# Patient Record
Sex: Male | Born: 1980 | Race: White | Hispanic: No | Smoking: Never smoker
Health system: Southern US, Community
[De-identification: ages and names within clinical notes are randomized; demographics above are authoritative.]

## PROBLEM LIST (undated history)

## (undated) DIAGNOSIS — M549 Dorsalgia, unspecified: Secondary | ICD-10-CM

## (undated) DIAGNOSIS — B009 Herpesviral infection, unspecified: Secondary | ICD-10-CM

## (undated) DIAGNOSIS — T7840XA Allergy, unspecified, initial encounter: Secondary | ICD-10-CM

## (undated) DIAGNOSIS — L409 Psoriasis, unspecified: Secondary | ICD-10-CM

## (undated) DIAGNOSIS — F32A Depression, unspecified: Secondary | ICD-10-CM

## (undated) DIAGNOSIS — A4902 Methicillin resistant Staphylococcus aureus infection, unspecified site: Secondary | ICD-10-CM

## (undated) DIAGNOSIS — M1990 Unspecified osteoarthritis, unspecified site: Secondary | ICD-10-CM

## (undated) DIAGNOSIS — G709 Myoneural disorder, unspecified: Secondary | ICD-10-CM

## (undated) HISTORY — DX: Psoriasis, unspecified: L40.9

## (undated) HISTORY — DX: Unspecified osteoarthritis, unspecified site: M19.90

## (undated) HISTORY — DX: Dorsalgia, unspecified: M54.9

## (undated) HISTORY — DX: Depression, unspecified: F32.A

## (undated) HISTORY — DX: Methicillin resistant Staphylococcus aureus infection, unspecified site: A49.02

## (undated) HISTORY — DX: Myoneural disorder, unspecified: G70.9

## (undated) HISTORY — DX: Allergy, unspecified, initial encounter: T78.40XA

## (undated) HISTORY — PX: KNEE ARTHROTOMY: SUR107

## (undated) HISTORY — DX: Herpesviral infection, unspecified: B00.9

---
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
## 2002-08-12 ENCOUNTER — Ambulatory Visit (HOSPITAL_COMMUNITY): Admission: RE | Admit: 2002-08-12 | Discharge: 2002-08-12 | Payer: Self-pay

## 2005-07-11 HISTORY — PX: TONSILLECTOMY: SUR1361

## 2005-08-11 ENCOUNTER — Ambulatory Visit (HOSPITAL_COMMUNITY): Admission: RE | Admit: 2005-08-11 | Discharge: 2005-08-11 | Payer: Self-pay | Admitting: Otolaryngology

## 2011-06-06 ENCOUNTER — Ambulatory Visit: Payer: Self-pay | Admitting: Sports Medicine

## 2011-11-09 DIAGNOSIS — A6000 Herpesviral infection of urogenital system, unspecified: Secondary | ICD-10-CM | POA: Insufficient documentation

## 2011-12-29 ENCOUNTER — Ambulatory Visit: Payer: Self-pay | Admitting: Physician Assistant

## 2012-01-03 ENCOUNTER — Ambulatory Visit: Payer: Self-pay | Admitting: Family Medicine

## 2012-01-03 VITALS — BP 128/73 | HR 78 | Temp 98.3°F | Resp 16 | Ht 71.5 in | Wt 207.0 lb

## 2012-01-03 DIAGNOSIS — M25569 Pain in unspecified knee: Secondary | ICD-10-CM | POA: Insufficient documentation

## 2012-01-03 DIAGNOSIS — J019 Acute sinusitis, unspecified: Secondary | ICD-10-CM

## 2012-01-03 DIAGNOSIS — J329 Chronic sinusitis, unspecified: Secondary | ICD-10-CM | POA: Insufficient documentation

## 2012-01-03 DIAGNOSIS — G8929 Other chronic pain: Secondary | ICD-10-CM | POA: Insufficient documentation

## 2012-01-03 MED ORDER — CEFUROXIME AXETIL 250 MG PO TABS: 500.0000 mg | ORAL_TABLET | Freq: Two times a day (BID) | ORAL | Status: AC

## 2012-01-03 NOTE — Patient Instructions (Addendum)

## 2012-01-03 NOTE — Progress Notes (Signed)
  Subjective:    Patient ID: Walter Evans, male    DOB: [birth year], 31 y.o.   MRN: [id number]  HPI Thios 31 y.o. Cauc male has chronic recurrent sinus infections occurring once a year. 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 He uses a NETI pot and OTC decongestant with some relief. Complains  of sinus pressure with post nasal drainage and sore throat. Not febrile today. Now has a productive cough.  Pt has been evaluated by ENT with CT scan in past; advised to consider Septoplasty.    Review of Systems  Constitutional: Negative for chills and appetite change.  HENT: Positive for congestion, sore throat and sinus pressure. Negative for rhinorrhea.   Respiratory: Positive for cough. Negative for chest tightness and shortness of breath.   Neurological: Negative.        Objective:   Physical Exam  Nursing note and vitals reviewed. Constitutional: He is oriented to person, place, and time. He appears well-developed and well-nourished. No distress.  HENT:  Head: Normocephalic and atraumatic.  Right Ear: External ear normal.  Left Ear: External ear normal.       Posterior pharynx erythematous with cobblestoning; Sinuses tender with percussion  Eyes: Conjunctivae and EOM are normal. No scleral icterus.  Neck: Normal range of motion. Neck supple. No thyromegaly present.  Cardiovascular: Normal rate.   Pulmonary/Chest: Breath sounds normal. He is in respiratory distress.  Lymphadenopathy:    He has no cervical adenopathy.  Neurological: He is alert and oriented to person, place, and time. No cranial nerve deficit.  Skin: Skin is warm and dry.          Assessment & Plan:   1. Sinusitis, chronic   Continue NETI pot and other symptomatic measures                                   RX: Cefuroxime 500 mg    # 20  1 tablet bid      Pt will consider further evaluation by ENT but may  need CT of sinuses for updating status

## 2012-02-16 ENCOUNTER — Ambulatory Visit (INDEPENDENT_AMBULATORY_CARE_PROVIDER_SITE_OTHER): Payer: No Typology Code available for payment source | Admitting: Internal Medicine

## 2012-02-16 VITALS — BP 126/77 | HR 87 | Temp 99.0°F | Resp 16 | Ht 72.0 in | Wt 203.0 lb

## 2012-02-16 DIAGNOSIS — L03119 Cellulitis of unspecified part of limb: Secondary | ICD-10-CM

## 2012-02-16 DIAGNOSIS — L02419 Cutaneous abscess of limb, unspecified: Secondary | ICD-10-CM

## 2012-02-16 DIAGNOSIS — S81809A Unspecified open wound, unspecified lower leg, initial encounter: Secondary | ICD-10-CM

## 2012-02-16 DIAGNOSIS — L738 Other specified follicular disorders: Secondary | ICD-10-CM

## 2012-02-16 DIAGNOSIS — IMO0002 Reserved for concepts with insufficient information to code with codable children: Secondary | ICD-10-CM

## 2012-02-16 DIAGNOSIS — L739 Follicular disorder, unspecified: Secondary | ICD-10-CM

## 2012-02-16 DIAGNOSIS — B958 Unspecified staphylococcus as the cause of diseases classified elsewhere: Secondary | ICD-10-CM

## 2012-02-16 MED ORDER — DOXYCYCLINE HYCLATE 100 MG PO TABS: 100.0000 mg | ORAL_TABLET | Freq: Two times a day (BID) | ORAL | Status: DC

## 2012-02-16 MED ORDER — CEFTRIAXONE SODIUM 1 G IJ SOLR
1.0000 g | Freq: Once | INTRAMUSCULAR | Status: AC
  Administered 2012-02-16: 1 g via INTRAMUSCULAR

## 2012-02-16 MED ORDER — MUPIROCIN 2 % EX OINT: TOPICAL_OINTMENT | Freq: Two times a day (BID) | CUTANEOUS | Status: AC

## 2012-02-16 NOTE — Progress Notes (Signed)
  Subjective:    Patient ID: Walter Evans, male    DOB: 1980-11-16, 31 y.o.   MRN: [id number]  HPI Has many infected bug bites, some are now moderate abscesses, onlegs and arms mostly. He has used home tools to try and open and drain not with much success  Review of Systems     Objective:   Physical Exam  Nursing note and vitals reviewed. Constitutional: He is oriented to person, place, and time. He appears well-developed and well-nourished.  HENT:  Nose: Nose normal.  Eyes: EOM are normal.  Pulmonary/Chest: Effort normal.  Musculoskeletal: Normal range of motion. He exhibits no edema and no tenderness.  Neurological: He is alert and oriented to person, place, and time.  Skin: Skin is warm. Rash noted. There is erythema.  Psychiatric: He has a normal mood and affect.    Red indurated tender and warm areas arms and legs R thigh and calf the worst. General health is good and no fever       Assessment & Plan:  Rocephin 1g I and D larger wound leg Wound care

## 2012-02-16 NOTE — Patient Instructions (Addendum)
MRSA Overview MRSA stands for methicillin-resistant Staphylococcus aureus. It is a type of bacteria that is resistant to some common antibiotics. It can cause infections in the skin and many other places in the body. Staphylococcus aureus, often called "staph," is a bacteria that normally lives on the skin or in the nose. Staph on the surface of the skin or in the nose does not cause problems. However, if the staph enters the body through a cut, wound, or break in the skin, an infection can happen. Up until recently, infections with the MRSA type of staph mainly occurred in hospitals and other healthcare settings. There are now increasing problems with MRSA infections in the community as well. Infections with MRSA may be very serious or even life-threatening. Most MRSA infections are acquired in one of two ways:  Healthcare-associated MRSA (HA-MRSA)   This can be acquired by people in any healthcare setting. MRSA can be a big problem for hospitalized people, people in nursing homes, people in rehabilitation facilities, people with weakened immune systems, dialysis patients, and those who have had surgery.   Community-associated MRSA (CA-MRSA)   Community spread of MRSA is becoming more common. It is known to spread in crowded settings, in jails and prisons, and in situations where there is close skin-to-skin contact, such as during sporting events or in locker rooms. MRSA can be spread through shared items, such as children's toys, razors, towels, or sports equipment.  CAUSES  All staph, including MRSA, are normally harmless unless they enter the body through a scratch, cut, or wound, such as with surgery. All staph, including MRSA, can be spread from person-to-person by touching contaminated objects or through direct contact. SPECIAL GROUPS MRSA can present problems for special groups of people. Some of these groups include:  Breastfeeding women.   The most common problem is MRSA infection of the  breast (mastitis). There is evidence that MRSA can be passed to an infant from infected breast milk. Your caregiver may recommend that you stop breastfeeding until the mastitis is under control.   If you are breastfeeding and have a MRSA infection in a place other than the breast, you may usually continue breastfeeding while under treatment. If taking antibiotics, ask your caregiver if it is safe to continue breastfeeding while taking your prescribed medicines.   Neonates (babies from birth to 1 month old) and infants (babies from 1 month to 1 year old).   There is evidence that MRSA can be passed to a newborn at birth if the mother has MRSA on the skin, in or around the birth canal, or an infection in the uterus, cervix, or vagina. MRSA infection can have the same appearance as a normal newborn or infant rash or several other skin infections. This can make it hard to diagnose MRSA.   Immune compromised people.   If you have an immune system problem, you may have a higher chance of developing a MRSA infection.   People after any type of surgery.   Staph in general, including MRSA, is the most common cause of infections occurring at the site of recent surgery.   People on long-term steroid medicines.   These kinds of medicines can lower your resistance to infection. This can increase your chance of getting MRSA.   People who have had frequent hospitalizations, live in nursing homes or other residential care facilities, have venous or urinary catheters, or have taken multiple courses of antibiotic therapy for any reason.  DIAGNOSIS  Diagnosis of MRSA is   done by cultures of fluid samples that may come from:  Swabs taken from cuts or wounds in infected areas.   Nasal swabs.   Saliva or deep cough specimens from the lungs (sputum).   Urine.   Blood.  Many people are "colonized" with MRSA but have no signs of infection. This means that people carry the MRSA germ on their skin or in their  nose and may never develop MRSA infection.  TREATMENT  Treatment varies and is based on how serious, how deep, or how extensive the infection is. For example:  Some skin infections, such as a small boil or abscess, may be treated by draining yellowish-white fluid (pus) from the site of the infection.   Deeper or more widespread soft tissue infections are usually treated with surgery to drain pus and with antibiotic medicine given by vein or by mouth. This may be recommended even if you are pregnant.   Serious infections may require a hospital stay.  If antibiotics are given, they may be needed for several weeks. PREVENTION  Because many people are colonized with staph, including MRSA, preventing the spread of the bacteria from person-to-person is most important. The best way to prevent the spread of bacteria and other germs is through proper hand washing or by using alcohol-based hand disinfectants. The following are other ways to help prevent MRSA infection within the hospital and community settings.   Healthcare settings:   Strict hand washing or hand disinfection procedures need to be followed before and after touching every patient.   Patients infected with MRSA are placed in isolation to prevent the spread of the bacteria.   Healthcare workers need to wear disposable gowns and gloves when touching or caring for patients infected with MRSA. Visitors may also be asked to wear a gown and gloves.   Hospital surfaces need to be disinfected frequently.   Community settings:   Wash your hands frequently with soap and water for at least 15 seconds. Otherwise, use alcohol-based hand disinfectants when soap and water is not available.   Make sure people who live with you wash their hands often, too.   Do not share personal items. For example, avoid sharing razors and other personal hygiene items, towels, clothing, and athletic equipment.   Wash and dry your clothes and bedding at the  warmest temperatures recommended on the labels.   Keep wounds covered. Pus from infected sores may contain MRSA and other bacteria. Keep cuts and abrasions clean and covered with germ-free (sterile), dry bandages until they are healed.   If you have a wound that appears infected, ask your caregiver if a culture for MRSA and other bacteria should be done.   If you are breastfeeding, talk to your caregiver about MRSA. You may be asked to temporarily stop breastfeeding.  HOME CARE INSTRUCTIONS   Take your antibiotics as directed. Finish them even if you start to feel better.   Avoid close contact with those around you as much as possible. Do not use towels, razors, toothbrushes, bedding, or other items that will be used by others.   To fight the infection, follow your caregiver's instructions for wound care. Wash your hands before and after changing your bandages.   If you have an intravascular device, such as a catheter, make sure you know how to care for it.   Be sure to tell any healthcare providers that you have MRSA so they are aware of your infection.  SEEK IMMEDIATE MEDICAL CARE IF:     The infection appears to be getting worse. Signs include:   Increased warmth, redness, or tenderness around the wound site.   A red line that extends from the infection site.   A dark color in the area around the infection.   Wound drainage that is tan, yellow, or green.   A bad smell coming from the wound.   You feel sick to your stomach (nauseous) and throw up (vomit) or cannot keep medicine down.   You have a fever.   Your baby is older than 3 months with a rectal temperature of 102 F (38.9 C) or higher.   Your baby is 3 months old or younger with a rectal temperature of 100.4 F (38 C) or higher.   You have difficulty breathing.  MAKE SURE YOU:   Understand these instructions.   Will watch your condition.   Will get help right away if you are not doing well or get worse.    Document Released: 06/27/2005 Document Revised: 06/16/2011 Document Reviewed: 09/29/2010 [employer] Patient Information 2012 [employer]. 

## 2012-02-16 NOTE — Progress Notes (Signed)
Patient ID: GUNTHER ZAWADZKI MRN: [id number], DOB: 1981-03-27, 31 y.o. Date of Encounter: 02/16/2012, 1:55 PM    PROCEDURE NOTE: Verbal consent obtained. Betadine prep per usual protocol. Local anesthesia obtained with 2% lidocaine plain.  1 cm incision made with 11 blade along lesion.  Culture taken. Minimal purulence expressed. Lesion explored revealing no loculations. Packed with 1/4 packing plain. Dressed. Wound care instructions including precautions with patient. Patient tolerated the procedure well. Recheck in 48 hours.      Grier Mitts, PA-C 02/16/2012 1:55 PM

## 2012-02-18 ENCOUNTER — Ambulatory Visit (INDEPENDENT_AMBULATORY_CARE_PROVIDER_SITE_OTHER): Payer: No Typology Code available for payment source | Admitting: Physician Assistant

## 2012-02-18 VITALS — BP 125/75 | HR 81 | Temp 98.0°F | Resp 16 | Ht 72.0 in | Wt 205.0 lb

## 2012-02-18 DIAGNOSIS — L02419 Cutaneous abscess of limb, unspecified: Secondary | ICD-10-CM

## 2012-02-18 DIAGNOSIS — L03119 Cellulitis of unspecified part of limb: Secondary | ICD-10-CM

## 2012-02-18 NOTE — Progress Notes (Signed)
Patient ID: Walter Evans MRN: [id number], DOB: 04/10/1981 31 y.o. Date of Encounter: 02/18/2012, 1:12 PM  Chief Complaint: Wound care   See previous note  HPI: 31 y.o. y/o male presents for wound care s/p I&D on 02/16/12. Doing well No issues or complaints Afebrile/ no chills No nausea or vomiting Tolerating doxycycline. Pain stable. Daily dressing change Previous note reviewed  No past medical history on file.   Home Meds: Prior to Admission medications   Medication Sig Start Date End Date Taking? Authorizing Provider  doxycycline (VIBRA-TABS) 100 MG tablet Take 1 tablet (100 mg total) by mouth 2 (two) times daily. 02/16/12 02/26/12 Yes Jonita Albee, MD  mupirocin ointment (BACTROBAN) 2 % Apply topically 2 (two) times daily. 02/16/12 02/23/12 Yes Jonita Albee, MD    Allergies:  Allergies  Allergen Reactions  . Shellfish Allergy     unknown    ROS: Constitutional: Afebrile, no chills Cardiovascular: negative for chest pain or palpitations Dermatological: Positive for wound. Negative for erythema, pain, or warmth. Positive for purulent drainage.  GI: No nausea or vomiting   EXAM: Physical Exam: Blood pressure 125/75, pulse 81, temperature 98 F (36.7 C), temperature source Oral, resp. rate 16, height 6' (1.829 m), weight 205 lb (92.987 kg)., Body mass index is 27.80 kg/(m^2). General: Well developed, well nourished, in no acute distress. Nontoxic appearing. Head: Normocephalic, atraumatic, sclera non-icteric.  Neck: Supple. Lungs: Breathing is unlabored. Heart: Normal rate. Skin:  Warm and moist. Dressing and packing in place. No induration, erythema, or tenderness to palpation. Neuro: Alert and oriented X 3. Moves all extremities spontaneously. Normal gait.  Psych:  Responds to questions appropriately with a normal affect.       PROCEDURE: Dressing and packing removed. No purulence expressed. There was yellow purulence on the bandage. Wound bed healthy. Eschar  debrided Irrigated with 2% plain lidocaine 5 cc. Repacked with 1/4 plain packing. Dressing applied  LAB: Culture:   A/P: 31 y.o. y/o male with leg cellulitis/abscess as above s/p I&D on Wound care per above Continue doxycycline. Pain well controlled Daily dressing changes Recheck 48 hours  Signed, Rhoderick Moody, PA-C 02/18/2012 1:12 PM

## 2012-02-19 LAB — WOUND CULTURE
Gram Stain: NONE SEEN
Gram Stain: NONE SEEN
Gram Stain: NONE SEEN

## 2012-02-20 ENCOUNTER — Ambulatory Visit (INDEPENDENT_AMBULATORY_CARE_PROVIDER_SITE_OTHER): Payer: No Typology Code available for payment source | Admitting: Physician Assistant

## 2012-02-20 VITALS — BP 119/70 | HR 70 | Temp 98.9°F | Resp 18

## 2012-02-20 DIAGNOSIS — L02419 Cutaneous abscess of limb, unspecified: Secondary | ICD-10-CM

## 2012-02-20 DIAGNOSIS — L03119 Cellulitis of unspecified part of limb: Secondary | ICD-10-CM

## 2012-02-20 NOTE — Progress Notes (Signed)
Patient ID: [name] MRN: [id number], DOB: 1981/02/17 31 y.o. Date of Encounter: 02/20/2012, 2:19 PM  Chief Complaint: Wound care   See previous note  HPI: 31 y.o. y/o male presents for wound care s/p I&D on 02/16/12. Doing well No issues or complaints Afebrile/ no chills No nausea or vomiting Tolerating doxycycline. Pain improving.  Daily dressing change Previous note reviewed  No past medical history on file.   Home Meds: Prior to Admission medications   Medication Sig Start Date End Date Taking? Authorizing Provider  doxycycline (VIBRA-TABS) 100 MG tablet Take 1 tablet (100 mg total) by mouth 2 (two) times daily. 02/16/12 02/26/12 Yes Walter Albee, MD  mupirocin ointment (BACTROBAN) 2 % Apply topically 2 (two) times daily. 02/16/12 02/23/12 Yes Walter Albee, MD    Allergies:  Allergies  Allergen Reactions  . Shellfish Allergy     unknown    ROS: Constitutional: Afebrile, no chills Cardiovascular: negative for chest pain or palpitations Dermatological: Positive for wound. Negative for erythema, pain, or warmth.  GI: No nausea or vomiting   EXAM: Physical Exam: Blood pressure 119/70, pulse 70, temperature 98.9 F (37.2 C), temperature source Oral, resp. rate 18., There is no height or weight on file to calculate BMI. General: Well developed, well nourished, in no acute distress. Nontoxic appearing. Head: Normocephalic, atraumatic, sclera non-icteric.  Neck: Supple. Lungs: Breathing is unlabored. Heart: Normal rate. Skin:  Warm and moist. Dressing and packing in place. No induration, erythema, or tenderness to palpation. Neuro: Alert and oriented X 3. Moves all extremities spontaneously. Normal gait.  Psych:  Responds to questions appropriately with a normal affect.       PROCEDURE: Dressing and packing removed. No purulence expressed. Moderate amount of purulence on bandage and packing.  Wound bed healthy Irrigated with 1% plain lidocaine 5 cc. Repacked  with 1/4 plain. Dressing applied  LAB: Culture:   A/P: 31 y.o. y/o male with leg cellulitis/abscess as above s/p I&D on Wound care per above Continue doxycycline. Pain well controlled Daily dressing changes Recheck 48 hours  Signed, Walter Moody, PA-C 02/20/2012 2:19 PM

## 2012-02-22 ENCOUNTER — Ambulatory Visit (INDEPENDENT_AMBULATORY_CARE_PROVIDER_SITE_OTHER): Payer: No Typology Code available for payment source | Admitting: Physician Assistant

## 2012-02-22 VITALS — BP 128/76 | HR 65 | Temp 97.8°F | Resp 16 | Ht 72.0 in | Wt 205.0 lb

## 2012-02-22 DIAGNOSIS — L02419 Cutaneous abscess of limb, unspecified: Secondary | ICD-10-CM

## 2012-02-22 NOTE — Progress Notes (Signed)
   Patient ID: Walter Evans MRN: [id number], DOB: 04/18/1981 31 y.o. Date of Encounter: 02/22/2012, 9:37 AM  Primary Physician: No primary provider on file.  Chief Complaint: Wound care   See previous note  HPI: 31 y.o. y/o male presents for wound care s/p I&D on 02/16/12 Doing well No issues or complaints Afebrile/ no chills No nausea or vomiting Tolerating Doxycycline Pain resolving Daily dressing change Previous note reviewed  No past medical history on file.   Home Meds: Prior to Admission medications   Medication Sig Start Date End Date Taking? Authorizing Provider  doxycycline (VIBRA-TABS) 100 MG tablet Take 1 tablet (100 mg total) by mouth 2 (two) times daily. 02/16/12 02/26/12 Yes Jonita Albee, MD  mupirocin ointment (BACTROBAN) 2 % Apply topically 2 (two) times daily. 02/16/12 02/23/12 Yes Jonita Albee, MD    Allergies:  Allergies  Allergen Reactions  . Shellfish Allergy     unknown    ROS: Constitutional: Afebrile, no chills Cardiovascular: negative for chest pain or palpitations Dermatological: Positive for wound. Negative for erythema, pain, or warmth  GI: No nausea or vomiting   EXAM: Physical Exam: Blood pressure 128/76, pulse 65, temperature 97.8 F (36.6 C), temperature source Oral, resp. rate 16, height 6' (1.829 m), weight 205 lb (92.987 kg), SpO2 98.00%., Body mass index is 27.80 kg/(m^2). General: Well developed, well nourished, in no acute distress. Nontoxic appearing. Head: Normocephalic, atraumatic, sclera non-icteric.  Neck: Supple. Lungs: Breathing is unlabored. Heart: Normal rate. Skin:  Warm and moist. Dressing and packing in place. No induration, erythema, or tenderness to palpation. Neuro: Alert and oriented X 3. Moves all extremities spontaneously. Normal gait.  Psych:  Responds to questions appropriately with a normal affect.   PROCEDURE: Dressing and packing removed. No purulence expressed Wound bed healthy with granulation  tissue Irrigated with 1% plain lidocaine 5 cc. Repacked with small amount of 1/4 inch plain packing Dressing applied  LAB: Culture: MRSA  A/P: 31 y.o. y/o male with cellulitis/abscess as above s/p I&D on 02/16/12 -Wound care per above -Continue Doxycycline -Pain well controlled -Daily dressing changes -Recheck 48 hours  Signed, Eula Listen, PA-C 02/22/2012 9:37 AM

## 2012-02-24 ENCOUNTER — Ambulatory Visit (INDEPENDENT_AMBULATORY_CARE_PROVIDER_SITE_OTHER): Payer: No Typology Code available for payment source | Admitting: Physician Assistant

## 2012-02-24 ENCOUNTER — Encounter: Payer: Self-pay | Admitting: Physician Assistant

## 2012-02-24 VITALS — BP 115/71 | HR 64 | Temp 98.6°F | Resp 18 | Ht 72.0 in | Wt 207.0 lb

## 2012-02-24 DIAGNOSIS — S81809A Unspecified open wound, unspecified lower leg, initial encounter: Secondary | ICD-10-CM

## 2012-02-24 DIAGNOSIS — L02419 Cutaneous abscess of limb, unspecified: Secondary | ICD-10-CM

## 2012-02-24 DIAGNOSIS — L738 Other specified follicular disorders: Secondary | ICD-10-CM

## 2012-02-24 DIAGNOSIS — IMO0002 Reserved for concepts with insufficient information to code with codable children: Secondary | ICD-10-CM

## 2012-02-24 DIAGNOSIS — L03119 Cellulitis of unspecified part of limb: Secondary | ICD-10-CM

## 2012-02-24 DIAGNOSIS — L739 Follicular disorder, unspecified: Secondary | ICD-10-CM

## 2012-02-24 DIAGNOSIS — B958 Unspecified staphylococcus as the cause of diseases classified elsewhere: Secondary | ICD-10-CM

## 2012-02-24 DIAGNOSIS — S81009A Unspecified open wound, unspecified knee, initial encounter: Secondary | ICD-10-CM

## 2012-02-24 DIAGNOSIS — A491 Streptococcal infection, unspecified site: Secondary | ICD-10-CM

## 2012-02-24 MED ORDER — DOXYCYCLINE HYCLATE 100 MG PO TABS: 100.0000 mg | ORAL_TABLET | Freq: Two times a day (BID) | ORAL | Status: DC

## 2012-02-24 NOTE — Progress Notes (Signed)
  Subjective:    Patient ID: Walter Evans, male    DOB: October 04, 1980, 31 y.o.   MRN: [id number]  HPI  Pt presents to clinic for wound recheck.  He has been changing drsg daily, noticing some bloody purulent d/c on bandage.  Less pain.  Taking abx without problems.    Review of Systems  Constitutional: Negative for fever and chills.       Objective:   Physical Exam  Vitals reviewed. Constitutional: He is oriented to person, place, and time. He appears well-developed and well-nourished.  HENT:  Head: Normocephalic and atraumatic.  Right Ear: External ear normal.  Left Ear: External ear normal.  Pulmonary/Chest: Effort normal.  Neurological: He is alert and oriented to person, place, and time.  Skin: Skin is warm and dry.       Drsg and packing removed.  Width is equal to depth of wound but there is some eschar at wound edges.  There is still some induration but no erythema. Irrigated with 2% lido and repacked with 1/4 in plain packing <1/2cm.  Drsg placed.  D/w pt I expect the packing to fall out within 3-4 days.  Psychiatric: He has a normal mood and affect. His behavior is normal. Judgment and thought content normal.          Assessment & Plan:   1. Abscess or cellulitis of leg  doxycycline (VIBRA-TABS) 100 MG tablet  2. Folliculitis  doxycycline (VIBRA-TABS) 100 MG tablet  3. Staph infection  doxycycline (VIBRA-TABS) 100 MG tablet  4. Wound, open, leg  doxycycline (VIBRA-TABS) 100 MG tablet   Pt to continue daily packing change.  I sent in another 7d of abx because he still has some drainage.  Recheck in 3 days if packing has not fallen out by then.  Pt understands and agrees with the above plan.

## 2012-02-27 ENCOUNTER — Ambulatory Visit (INDEPENDENT_AMBULATORY_CARE_PROVIDER_SITE_OTHER): Payer: No Typology Code available for payment source | Admitting: Family Medicine

## 2012-02-27 VITALS — BP 122/78 | HR 98 | Temp 98.4°F | Resp 16

## 2012-02-27 DIAGNOSIS — L0291 Cutaneous abscess, unspecified: Secondary | ICD-10-CM

## 2012-02-27 DIAGNOSIS — L0390 Cellulitis, unspecified: Secondary | ICD-10-CM

## 2012-02-27 NOTE — Progress Notes (Signed)
[facility] [address] [place] [address] [id number]  Date:  02/27/2012   Name:  Walter Evans   DOB:  [birth year]   MRN:  [id number]  PCP:  No primary provider on file.    Chief Complaint: Wound Check   History of Present Illness:  Walter Evans is a 31 y.o. very pleasant male patient who presents with the following:  Here to have his right thigh cellulitis.  He is doing much better.  He has been in several times and had I and D and packing changes.  His pain is much better at this time.  No drainage, no fever  Patient Active Problem List  Diagnosis  . Sinusitis, chronic  . 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  No past surgical history on file.  History  Substance Use Topics  . Smoking status: Never Smoker   . Smokeless tobacco: Not on file  . Alcohol Use: Not on file    No family history on file.  Allergies  Allergen Reactions  . Shellfish Allergy     unknown    Medication list has been reviewed and updated.  No current outpatient prescriptions on file prior to visit.    Review of Systems:  As per HPI- otherwise negative  Physical Examination: Filed Vitals:   02/27/12 1412  BP: 122/78  Pulse: 98  Temp: 98.4 F (36.9 C)  Resp: 16   There were no vitals filed for this visit. There is no height or weight on file to calculate BMI. Ideal Body Weight:     GEN: WDWN, NAD, Non-toxic, Alert & Oriented x 3 HEENT: Atraumatic, Normocephalic.  Ears and Nose: No external deformity. EXTR: No clubbing/cyanosis/edema NEURO: Normal gait.  PSYCH: Normally interactive. Conversant. Not depressed or anxious appearing.  Calm demeanor.  Right inner thigh- removed a tiny piece of packing.  Wound is healing well, granulation tissue present.  Dress with band- aid  Assessment and Plan: 1. Cellulitis    As above.  No further follow-up needed unless getting worse  Niobe Dick, MD

## 2012-05-31 ENCOUNTER — Ambulatory Visit (INDEPENDENT_AMBULATORY_CARE_PROVIDER_SITE_OTHER): Payer: No Typology Code available for payment source | Admitting: Family Medicine

## 2012-05-31 VITALS — BP 128/78 | HR 119 | Temp 98.3°F | Resp 18 | Ht 71.5 in | Wt 206.0 lb

## 2012-05-31 DIAGNOSIS — A6000 Herpesviral infection of urogenital system, unspecified: Secondary | ICD-10-CM

## 2012-05-31 MED ORDER — VALACYCLOVIR HCL 500 MG PO TABS: 500.0000 mg | ORAL_TABLET | Freq: Every day | ORAL | Status: DC

## 2012-05-31 NOTE — Patient Instructions (Signed)
Take 1 Valtrex each day for suppression.  If outbreak occurs - increase dose to twice per day for 3 days, then back to once per day.  Recheck in 6 months. Return to the clinic or go to the nearest emergency room if any of your symptoms worsen or new symptoms occur.

## 2012-05-31 NOTE — Progress Notes (Signed)
  Subjective:    Patient ID: Walter Evans, male    DOB: July 25, 1980, 31 y.o.   MRN: [id number]  HPI Walter Evans is a 31 y.o. male  Hx of Genital HSV. Has had 5 outbreaks in past 6 months.  Initial outbreak 6 months ago - worst outbreak was initial outbreak.  Current outbreak - started yesterday.  Tried herbal balm, but out of Valtrex.  Usually takes 3 days of meds (500mg  BID for 3 days) during outbreak but only 3 refills, and only 6 pills, requiring ov for refills at other office - would like to be on daily dose with frequency of outbreaks.   Fiance has genital HSV as well - but hers is controlled with daily dose of valtrex.  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   Review of Systems  Genitourinary: Positive for genital sores. Negative for difficulty urinating.  Skin: Positive for rash.        Objective:   Physical Exam  Constitutional: He is oriented to person, place, and time. He appears well-developed and well-nourished.  HENT:  Head: Normocephalic and atraumatic.  Pulmonary/Chest: Effort normal.  Genitourinary:     Neurological: He is alert and oriented to person, place, and time.  Skin: Skin is warm and dry.  Psychiatric: He has a normal mood and affect. His behavior is normal.       Assessment & Plan:  Walter Evans is a 31 y.o. male  . 1. Genital HSV  valACYclovir (VALTREX) 500 MG tablet   Increased frequency of outbreaks. Will start daily dose of valtrex 500mg  qd, then BID during outbreak.  Recheck in 6 months to determine frequency of outbreaks. May need 1gram dose. rtc precautions.   Patient Instructions  Take 1 Valtrex each day for suppression.  If outbreak occurs - increase dose to twice per day for 3 days, then back to once per day.  Recheck in 6 months. Return to the clinic or go to the nearest emergency room if any of your symptoms worsen or new symptoms occur.

## 2012-08-20 ENCOUNTER — Encounter: Payer: Self-pay | Admitting: Physician Assistant

## 2012-08-20 ENCOUNTER — Ambulatory Visit (INDEPENDENT_AMBULATORY_CARE_PROVIDER_SITE_OTHER): Payer: No Typology Code available for payment source | Admitting: Physician Assistant

## 2012-08-20 VITALS — BP 135/76 | HR 83 | Temp 97.8°F | Resp 16 | Ht 71.5 in | Wt 211.0 lb

## 2012-08-20 DIAGNOSIS — L409 Psoriasis, unspecified: Secondary | ICD-10-CM

## 2012-08-20 DIAGNOSIS — L408 Other psoriasis: Secondary | ICD-10-CM

## 2012-08-20 MED ORDER — TRIAMCINOLONE ACETONIDE 0.025 % EX CREA: TOPICAL_CREAM | CUTANEOUS | Status: DC

## 2012-08-20 MED ORDER — TRIAMCINOLONE ACETONIDE 0.025 % EX LOTN: TOPICAL_LOTION | CUTANEOUS | Status: DC

## 2012-08-20 NOTE — Progress Notes (Signed)
   Patient ID: Walter Evans MRN: [id number], DOB: Aug 11, 1980, 32 y.o. Date of Encounter: 08/20/2012, 5:45 PM  Primary Physician: No primary provider on file.  Chief Complaint: Psoriasis   HPI: 32 y.o. year old male with history below presents for medication refill for psoriasis. Longstanding issue. Notes distribution along eyebrows, upper lip, and nasal folds. Last flare up approximately 5 weeks ago. This flare also caused some scaling in the folds of his ears, but none in the canals. He cannot find an OTC product that works for him and he does not want to take a po medication as his flares are infrequent. He is otherwise doing well.    Past Medical History  Diagnosis Date  . Psoriasis      Home Meds: Prior to Admission medications   Medication Sig Start Date End Date Taking? Authorizing Provider  valACYclovir (VALTREX) 500 MG tablet Take 1 tablet (500 mg total) by mouth daily. 05/31/12  Yes Shade Flood, MD    Allergies:  Allergies  Allergen Reactions  . Shellfish Allergy     unknown    History   Social History  . Marital Status: Single    Spouse Name: N/A    Number of Children: N/A  . Years of Education: N/A   Occupational History  . Not on file.   Social History Main Topics  . Smoking status: Never Smoker   . Smokeless tobacco: Not on file  . Alcohol Use: Not on file  . Drug Use: Not on file  . Sexually Active: Not on file   Other Topics Concern  . Not on file   Social History Narrative  . No narrative on file     Review of Systems: Constitutional: negative for chills, fever, night sweats, weight changes, or fatigue  Cardiovascular: negative for chest pain or palpitations Respiratory: negative for hemoptysis, wheezing, shortness of breath, or cough Dermatological: positive for rash   Physical Exam: Blood pressure 135/76, pulse 83, temperature 97.8 F (36.6 C), temperature source Oral, resp. rate 16, height 5' 11.5" (1.816 m), weight 211 lb  (95.709 kg)., Body mass index is 29.02 kg/(m^2). General: Well developed, well nourished, in no acute distress. Head: Normocephalic, atraumatic, eyes without discharge, sclera non-icteric, nares are without discharge. Bilateral auditory canals clear, TM's are without perforation, pearly grey and translucent with reflective cone of light bilaterally. Resolving scale along right pinna. Oral cavity moist, posterior pharynx without exudate, erythema, peritonsillar abscess, or post nasal drip.  Neck: Supple. No thyromegaly. Full ROM. No lymphadenopathy. Lungs: Clear bilaterally to auscultation without wheezes, rales, or rhonchi. Breathing is unlabored. Heart: RRR with S1 S2. No murmurs, rubs, or gallops appreciated. Msk:  Strength and tone normal for age. Extremities/Skin: Warm and dry. No clubbing or cyanosis. No edema. No rashes or suspicious lesions. Neuro: Alert and oriented X 3. Moves all extremities spontaneously. Gait is normal. CNII-XII grossly in tact. Psych:  Responds to questions appropriately with a normal affect.     ASSESSMENT AND PLAN:  32 y.o. male with psoriasis -No current flare -Treatment stronger than OTC -Does not want to take po meds -Triamcinolone 0.025% lotion Apply sparingly to affected area prn #60 mL RF 5 -Triamcinolone 0.025% cream Apply sparingly to affected area prn #80 grams RF 5 -Discussed with patient risks of the above causing thinning of the skin, he understands these risks and assumes responsibility for the usage -RTC prn  Signed, Eula Listen, PA-C 08/20/2012 5:45 PM

## 2012-12-04 ENCOUNTER — Ambulatory Visit (INDEPENDENT_AMBULATORY_CARE_PROVIDER_SITE_OTHER): Payer: No Typology Code available for payment source | Admitting: Family Medicine

## 2012-12-04 VITALS — BP 138/86 | HR 70 | Temp 97.9°F | Resp 16 | Ht 72.0 in | Wt 213.0 lb

## 2012-12-04 DIAGNOSIS — J329 Chronic sinusitis, unspecified: Secondary | ICD-10-CM

## 2012-12-04 DIAGNOSIS — R05 Cough: Secondary | ICD-10-CM

## 2012-12-04 MED ORDER — AMOXICILLIN-POT CLAVULANATE 875-125 MG PO TABS: 1.0000 | ORAL_TABLET | Freq: Two times a day (BID) | ORAL | Status: DC

## 2012-12-04 NOTE — Patient Instructions (Addendum)

## 2012-12-04 NOTE — Progress Notes (Signed)
Subjective:    Patient ID: Walter Evans, male    DOB: 22-Nov-1980, 32 y.o.   MRN: [id number] Chief Complaint  Patient presents with  . Sore Throat   HPI  2d of feeling ill with sore throat and felt feverish and chills. Pushing fluids.  A little sinus pressure and congestion, taking guaifenesin and sudafed prn.  A HA yesterday, a non-productive cough - throat feels very dry.  Not sleeping well due to other circumstances. Has an extensive h/o chronic sinus problem. Is followed by ENT who advised him to never use ANY nasal sprays - states he was told even rx can cause chronic problems long term, wouldn't work for him, and cause increased blood pressure - that he was just to use sudafed and mucinex only.  Also can't take steroids - don't work for him.  Has no problems breathing in through his nose but can't blow anything out so sinus rinses and netti pots don't work - just make his ears hurt. Was advised to have a septoplasty.  Past Medical History  Diagnosis Date  . Psoriasis    Current Outpatient Prescriptions on File Prior to Visit  Medication Sig Dispense Refill  . triamcinolone (KENALOG) 0.025 % cream Apply sparingly to affected area.  80 g  5  . Triamcinolone Acetonide 0.025 % LOTN Apply sparingly to affected area prn  60 mL  5  . valACYclovir (VALTREX) 500 MG tablet Take 1 tablet (500 mg total) by mouth daily.  90 tablet  1   No current facility-administered medications on file prior to visit.   Allergies  Allergen Reactions  . Shellfish Allergy     unknown     Review of Systems  Constitutional: Positive for fever, chills, diaphoresis, activity change, appetite change and fatigue. Negative for unexpected weight change.  HENT: Positive for ear pain, congestion, sore throat, rhinorrhea, postnasal drip and sinus pressure. Negative for mouth sores, neck pain and neck stiffness.   Respiratory: Positive for cough. Negative for shortness of breath.   Cardiovascular: Negative for chest  pain.  Gastrointestinal: Negative for nausea, vomiting, abdominal pain, diarrhea and constipation.  Genitourinary: Negative for dysuria.  Musculoskeletal: Negative for myalgias and arthralgias.  Skin: Negative for rash.  Neurological: Positive for headaches. Negative for syncope.  Hematological: Negative for adenopathy.  Psychiatric/Behavioral: Positive for sleep disturbance.      BP 138/86  Pulse 70  Temp(Src) 97.9 F (36.6 C) (Oral)  Resp 16  Ht 6' (1.829 m)  Wt 213 lb (96.616 kg)  BMI 28.88 kg/m2  SpO2 98% Objective:   Physical Exam  Constitutional: He is oriented to person, place, and time. He appears well-developed and well-nourished. He does not appear ill. No distress.  HENT:  Head: Normocephalic and atraumatic.  Right Ear: External ear and ear canal normal. Tympanic membrane is retracted. A middle ear effusion is present.  Left Ear: External ear and ear canal normal. Tympanic membrane is retracted. A middle ear effusion is present.  Nose: Mucosal edema and rhinorrhea present. Right sinus exhibits maxillary sinus tenderness. Left sinus exhibits maxillary sinus tenderness.  Mouth/Throat: Uvula is midline and mucous membranes are normal. Posterior oropharyngeal erythema present. No oropharyngeal exudate or posterior oropharyngeal edema.  Eyes: Conjunctivae are normal. Right eye exhibits no discharge. Left eye exhibits no discharge. No scleral icterus.  Neck: Normal range of motion. Neck supple. No thyromegaly present.  Cardiovascular: Normal rate, regular rhythm, normal heart sounds and intact distal pulses.   Pulmonary/Chest: Effort normal and breath  sounds normal. No respiratory distress.  Lymphadenopathy:       Head (right side): Submandibular adenopathy present.       Head (left side): Submandibular adenopathy present.    He has no cervical adenopathy.       Right: No supraclavicular adenopathy present.       Left: No supraclavicular adenopathy present.  Neurological:  He is alert and oriented to person, place, and time.  Skin: Skin is warm and dry. He is not diaphoretic. No erythema.  Psychiatric: He has a normal mood and affect. His behavior is normal.      Assessment & Plan:  Sinusitis, chronic - Pt declines most of the usual medications and treatments that I use for sinus congestion.  Pt reassured that no sign of bacterial infection at this time but if he worsens with focal sxs, bloody or purulent sinus discharge, fevers, etc - then he can start on the Augmentin but will hold on to paper rx and not fill now.  Advised to f/u w/ ENT to consider surgery due to reports of limited effective interventions for this chronic recurrent problem.  Meds ordered this encounter  Medications  . amoxicillin-clavulanate (AUGMENTIN) 875-125 MG per tablet    Sig: Take 1 tablet by mouth 2 (two) times daily.    Dispense:  20 tablet    Refill:  0

## 2013-06-17 ENCOUNTER — Other Ambulatory Visit: Payer: Self-pay | Admitting: Family Medicine

## 2013-06-17 NOTE — Telephone Encounter (Signed)
Due for follow up

## 2013-09-10 ENCOUNTER — Other Ambulatory Visit: Payer: Self-pay | Admitting: Physician Assistant

## 2013-09-23 ENCOUNTER — Encounter: Payer: Self-pay | Admitting: Physician Assistant

## 2013-09-23 ENCOUNTER — Ambulatory Visit (INDEPENDENT_AMBULATORY_CARE_PROVIDER_SITE_OTHER): Payer: No Typology Code available for payment source | Admitting: Physician Assistant

## 2013-09-23 VITALS — BP 124/80 | HR 80 | Temp 98.5°F | Resp 16 | Ht 71.5 in | Wt 197.0 lb

## 2013-09-23 DIAGNOSIS — R05 Cough: Secondary | ICD-10-CM

## 2013-09-23 DIAGNOSIS — J019 Acute sinusitis, unspecified: Secondary | ICD-10-CM

## 2013-09-23 DIAGNOSIS — R059 Cough, unspecified: Secondary | ICD-10-CM

## 2013-09-23 DIAGNOSIS — B009 Herpesviral infection, unspecified: Secondary | ICD-10-CM

## 2013-09-23 MED ORDER — AMOXICILLIN-POT CLAVULANATE 875-125 MG PO TABS: 1.0000 | ORAL_TABLET | Freq: Two times a day (BID) | ORAL | Status: DC

## 2013-09-23 MED ORDER — VALACYCLOVIR HCL 500 MG PO TABS: 500.0000 mg | ORAL_TABLET | Freq: Every day | ORAL | Status: DC

## 2013-09-23 NOTE — Progress Notes (Signed)
Subjective:    Patient ID: Walter Evans, male    DOB: 11/26/1980, 33 y.o.   MRN: [id number]  HPI Primary Physician: No PCP Per Patient  Chief Complaint: Medication refill   HPI: 33 y.o. male with history below presents for 2 issues.   1) Medication refill: Needs refill of Valtrex 500 mg daily. Tolerating without issues. No recent flares. Doing well.   2) URI: Long history of chronic sinusitis. Developed nasal congestion, post nasal drip, sinus pressure, and cough a couple of weeks ago. Cough is on and off. Symptoms are just like his previous sinus infections. Sinus pressure is greatest along the right maxillary sinus. Afebrile. No chills. Normal hearing. Mucinex helps.   Past Medical History  Diagnosis Date  . Psoriasis   . HSV-2 (herpes simplex virus 2) infection   . MRSA (methicillin resistant Staphylococcus aureus)      Home Meds: Prior to Admission medications   Medication Sig Start Date End Date Taking? Authorizing Provider         triamcinolone (KENALOG) 0.025 % cream Apply sparingly to affected area. 08/20/12  Yes Mileydi Milsap M Dalasia Predmore, PA-C         valACYclovir (VALTREX) 500 MG tablet TAKE 1 TABLET BY MOUTH EVERY DAY   Yes Collene Leyden, PA-C    Allergies:  Allergies  Allergen Reactions  . Shellfish Allergy     unknown    History   Social History  . Marital Status: Single    Spouse Name: N/A    Number of Children: N/A  . Years of Education: N/A   Occupational History  . Not on file.   Social History Main Topics  . Smoking status: Never Smoker   . Smokeless tobacco: Not on file  . Alcohol Use: Not on file  . Drug Use: Not on file  . Sexual Activity: Not on file   Other Topics Concern  . Not on file   Social History Narrative  . No narrative on file     Review of Systems  Constitutional: Negative for fever, chills and fatigue.  HENT: Positive for congestion, postnasal drip, sinus pressure and sore throat. Negative for ear pain, hearing loss and  rhinorrhea.        Right maxillary sinus pressure.   Respiratory: Positive for cough.        Cough is productive with Mucinex.   Skin: Negative for rash.  Neurological: Positive for headaches.       Sinus pressure.        Objective:   Physical Exam  Physical Exam: Blood pressure 124/80, pulse 80, temperature 98.5 F (36.9 C), temperature source Oral, resp. rate 16, height 5' 11.5" (1.816 m), weight 197 lb (89.359 kg), SpO2 98.00%., Body mass index is 27.1 kg/(m^2). General: Well developed, well nourished, in no acute distress. Head: Normocephalic, atraumatic, eyes without discharge, sclera non-icteric, nares are without discharge. Bilateral auditory canals clear, TM's are without perforation, pearly grey and translucent with reflective cone of light bilaterally. Right sided maxillary sinus TTP. Oral cavity moist, posterior pharynx with post nasal drip. No exudate, erythema, or peritonsillar abscess. Uvula midline.   Neck: Supple. No thyromegaly. Full ROM. No lymphadenopathy. No nuchal rigidity.  Lungs: Clear bilaterally to auscultation without wheezes, rales, or rhonchi. Breathing is unlabored. Heart: RRR with S1 S2. No murmurs, rubs, or gallops appreciated. Msk:  Strength and tone normal for age. Extremities/Skin: Warm and dry. No clubbing or cyanosis. No edema. No rashes or suspicious lesions. Neuro:  Alert and oriented X 3. Moves all extremities spontaneously. Gait is normal. CNII-XII grossly in tact. Psych:  Responds to questions appropriately with a normal affect.        Assessment & Plan:  33 year old male with HSV 2 and sinusitis here for medication refill  1) HSV 2 -Refilled Valtrex 500 mg daily #30 RF 11 -Call if needs treatment for flare up -No recent flare ups -RTC prn  2) Sinusitis -Augmentin 875/125 mg 1 po bid #20 no RF -Mucinex   Christell Faith, MHS, PA-C [facility] [place], [place] [address] [place] 09/23/2013 2:39 PM

## 2013-10-28 ENCOUNTER — Encounter: Payer: Self-pay | Admitting: Physician Assistant

## 2013-10-28 ENCOUNTER — Ambulatory Visit (INDEPENDENT_AMBULATORY_CARE_PROVIDER_SITE_OTHER): Payer: No Typology Code available for payment source | Admitting: Family Medicine

## 2013-10-28 VITALS — BP 123/71 | HR 86 | Temp 97.6°F | Resp 16 | Ht 71.5 in | Wt 197.0 lb

## 2013-10-28 DIAGNOSIS — Z13220 Encounter for screening for lipoid disorders: Secondary | ICD-10-CM

## 2013-10-28 DIAGNOSIS — Z0000 Encounter for general adult medical examination without abnormal findings: Secondary | ICD-10-CM

## 2013-10-28 LAB — CBC
HCT: 45.2 % (ref 39.0–52.0)
Hemoglobin: 15.8 g/dL (ref 13.0–17.0)
MCH: 28.4 pg (ref 26.0–34.0)
MCHC: 35 g/dL (ref 30.0–36.0)
MCV: 81.1 fL (ref 78.0–100.0)
Platelets: 289 10*3/uL (ref 150–400)
RBC: 5.57 MIL/uL (ref 4.22–5.81)
RDW: 13.4 % (ref 11.5–15.5)
WBC: 5.5 10*3/uL (ref 4.0–10.5)

## 2013-10-28 NOTE — Progress Notes (Deleted)
Patient ID: Walter Evans MRN: [id number], DOB: October 21, 1980 33 y.o. Date of Encounter: 10/28/2013, 8:26 AM  Primary Physician: No PCP Per Patient  Chief Complaint: Physical (CPE)  HPI: 33 y.o. male with history noted below here for CPE. Doing well. Last physical was ***.  Review of Systems:*** Consitutional: No fever, chills, fatigue, night sweats, lymphadenopathy, or weight changes. Eyes: No visual changes, eye redness, or discharge. ENT/Mouth: Ears: No otalgia, tinnitus, hearing loss, discharge. Nose: No congestion, rhinorrhea, sinus pain, or epistaxis. Throat: No sore throat, post nasal drip, or teeth pain. Cardiovascular: No CP, palpitations, diaphoresis, DOE, edema, orthopnea, PND. Respiratory: No cough, hemoptysis, SOB, or wheezing. Gastrointestinal: No anorexia, dysphagia, reflux, pain, nausea, vomiting, hematemesis, diarrhea, constipation, BRBPR, or melena. Genitourinary: No dysuria, frequency, urgency, hematuria, incontinence, nocturia, decreased urinary stream, discharge, impotence, or testicular pain/masses. Musculoskeletal: No decreased ROM, myalgias, stiffness, joint swelling, or weakness. Skin: No rash, erythema, lesion changes, pain, warmth, jaundice, or pruritis. Neurological: No headache, dizziness, syncope, seizures, tremors, memory loss, coordination problems, or paresthesias. Psychological: No anxiety, depression, hallucinations, SI/HI. Endocrine: No fatigue, polydipsia, polyphagia, polyuria, or known diabetes.   Past Medical History  Diagnosis Date  . Psoriasis   . HSV-2 (herpes simplex virus 2) infection   . MRSA (methicillin resistant Staphylococcus aureus)      No past surgical history on file.  Home Meds:  Prior to Admission medications   Medication Sig Start Date End Date Taking? Authorizing Provider  amoxicillin-clavulanate (AUGMENTIN) 875-125 MG per tablet Take 1 tablet by mouth 2 (two) times daily. 09/23/13   Rise Mu, PA-C  triamcinolone  (KENALOG) 0.025 % cream Apply sparingly to affected area. 08/20/12   Rise Mu, PA-C  Triamcinolone Acetonide 0.025 % LOTN Apply sparingly to affected area prn 08/20/12   Areta Haber [employer], PA-C  valACYclovir (VALTREX) 500 MG tablet Take 1 tablet (500 mg total) by mouth daily. 09/23/13   Rise Mu, PA-C    Allergies:  Allergies  Allergen Reactions  . Shellfish Allergy     unknown    History   Social History  . Marital Status: Single    Spouse Name: N/A    Number of Children: N/A  . Years of Education: N/A   Occupational History  . Not on file.   Social History Main Topics  . Smoking status: Never Smoker   . Smokeless tobacco: Not on file  . Alcohol Use: Not on file  . Drug Use: Not on file  . Sexual Activity: Not on file   Other Topics Concern  . Not on file   Social History Narrative  . No narrative on file    Family History  Problem Relation Age of Onset  . Hypertension Mother   . Hypertension Father   . Cancer Father     and tumor -benign    Physical Exam:*** There were no vitals taken for this visit.  General: Well developed, well nourished, in no acute distress. HEENT: Normocephalic, atraumatic. Conjunctiva pink, sclera non-icteric. Pupils 2 mm constricting to 1 mm, round, regular, and equally reactive to light and accomodation. EOMI. Internal auditory canal clear. TMs with good cone of light and without pathology. Nasal mucosa pink. Nares are without discharge. No sinus tenderness. Oral mucosa pink. Dentition***. Pharynx without exudate.   Neck: Supple. Trachea midline. No thyromegaly. Full ROM. No lymphadenopathy. Lungs: Clear to auscultation bilaterally without wheezes, rales, or rhonchi. Breathing is of normal effort and unlabored. Cardiovascular: RRR with S1 S2. No murmurs, rubs,  or gallops appreciated. Distal pulses 2+ symmetrically. No carotid or abdominal bruits.*** Abdomen: Soft, non-tender, non-distended with normoactive bowel sounds. No  hepatosplenomegaly or masses. No rebound/guarding. No CVA tenderness. Without hernias.  Rectal: No external hemorrhoids or fissures. Rectal vault without masses.*** Prostate not enlarged, smooth, symmetrical, without nodules, or TTP.  Genitourinary: *** circumcised male. No penile lesions. Testes descended bilaterally, and smooth without tenderness or masses.  Musculoskeletal: Full range of motion and 5/5 strength throughout. Without swelling, atrophy, tenderness, crepitus, or warmth. Extremities without clubbing, cyanosis, or edema. Calves supple. Skin: Warm and moist without erythema, ecchymosis, wounds, or rash. Neuro: A+Ox3. CN II-XII grossly intact. Moves all extremities spontaneously. Full sensation throughout. Normal gait. DTR 2+ throughout upper and lower extremities. Finger to nose intact. Psych:  Responds to questions appropriately with a normal affect.   Studies:    CBC, CMET, Lipid, PSA, TSH all pending. Patient is ***   Assessment/Plan:  33 y.o. male here for CPE*** - -Healthy diet and exercise -Weight loss -Age appropriate anticipatory guidance   Signed, Christell Faith, PA-C [facility] [address], [place] [id number] [phone] 10/28/2013 8:26 AM

## 2013-10-28 NOTE — Progress Notes (Signed)
[facility] [address], [place] [address] [phone]  Date:  10/28/2013   Name:  Walter Evans   DOB:  [birth year]   MRN:  [id number]  PCP:  No PCP Per Patient    Chief Complaint: Annual Exam   History of Present Illness:  Walter Evans is a 33 y.o. very pleasant male patient who presents with the following:  Here today for a CPE.  He had fasting labs today.   He did have a tetanus shot in 2011, but is not sure if it was tdap or Td.   He notes a problem with persistent sinusiits.  He would like to maybe have sinus surgery sometime.  For the time being he uses mucinex D as needed.  He has had several CT scans of his sinuses.   He is married, a non- smoker.   Works in Therapist, occupational  Patient Active Problem List   Diagnosis Date Noted  . Sinusitis, chronic 01/03/2012  . 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 Psoriasis   . HSV-2 (herpes simplex virus 2) infection   . MRSA (methicillin resistant Staphylococcus aureus)   . Allergy   . Arthritis     History reviewed. No pertinent past surgical history.  History  Substance Use Topics  . Smoking status: Never Smoker   . Smokeless tobacco: Not on file  . Alcohol Use: 1.2 oz/week    2 Glasses of wine per week    Family History  Problem Relation Age of Onset  . Hypertension Mother   . Cancer Mother   . Heart disease Mother   . Hypertension Father   . Cancer Father     and tumor -benign  . Hyperlipidemia Father   . Cancer Maternal Grandmother   . Heart disease Maternal Grandmother   . Heart disease Maternal Grandfather   . Cancer Paternal Grandmother     Allergies  Allergen Reactions  . Shellfish Allergy     unknown    Medication list has been reviewed and updated.  Current Outpatient Prescriptions on File Prior to Visit  Medication Sig Dispense Refill  .  amoxicillin-clavulanate (AUGMENTIN) 875-125 MG per tablet Take 1 tablet by mouth 2 (two) times daily.  20 tablet  0  . triamcinolone (KENALOG) 0.025 % cream Apply sparingly to affected area.  80 g  5  . Triamcinolone Acetonide 0.025 % LOTN Apply sparingly to affected area prn  60 mL  5  . valACYclovir (VALTREX) 500 MG tablet Take 1 tablet (500 mg total) by mouth daily.  30 tablet  11   No current facility-administered medications on file prior to visit.    Review of Systems:  As per HPI- otherwise negative.   Physical Examination: Filed Vitals:   10/28/13 1122  BP: 123/71  Pulse: 86  Temp: 97.6 F (36.4 C)  Resp: 16   Filed Vitals:   10/28/13 1122  Height: 5' 11.5" (1.816 m)  Weight: 197 lb (89.359 kg)   Body mass index is 27.1 kg/(m^2). Ideal Body Weight: Weight in (lb) to have BMI = 25: 181.4  GEN: WDWN, NAD, Non-toxic, A & O x 3, looks well, mild overweight HEENT: Atraumatic, Normocephalic. Neck supple. No masses, No LAD.  Bilateral TM wnl, oropharynx normal.  PEERL,EOMI.   Ears and Nose: No external deformity. CV: RRR, No M/G/R. No  JVD. No thrill. No extra heart sounds. PULM: CTA B, no wheezes, crackles, rhonchi. No retractions. No resp. distress. No accessory muscle use. ABD: S, NT, ND. No rebound. No HSM. EXTR: No c/c/e. Wearing hinged right knee brace and carries a cane NEURO Normal gait.  PSYCH: Normally interactive. Conversant. Not depressed or anxious appearing.  Calm demeanor.  Gu: normal scrotum and testicles, normal penis, no inguinal hernia  Assessment and Plan: Physical exam - Plan: CBC, Comprehensive metabolic panel  Screening for hyperlipidemia - Plan: Lipid panel  CPE today.  Await labs.   See patient instructions for more details.   BP well controlled today He has chronic sinus problems but declines any other treatment today Chronic right knee problem- for now he is wearing a brace and does have an orthopedist to see as needed  Signed Lamar Blinks, MD

## 2013-10-28 NOTE — Patient Instructions (Signed)
Good to see you today. We'll be in touch with your labs in the next few days Try to find out if you had a Tdap (with pertussis booster) or Td at your last tetanus booster; if you have not yet had a Tdap you should have this once, then regular Td is ok.

## 2013-10-29 ENCOUNTER — Encounter: Payer: Self-pay | Admitting: Family Medicine

## 2013-10-29 LAB — LIPID PANEL
Cholesterol: 244 mg/dL — ABNORMAL HIGH (ref 0–200)
HDL: 57 mg/dL (ref >39)
LDL Cholesterol: 146 mg/dL — ABNORMAL HIGH (ref 0–99)
TRIGLYCERIDES: 203 mg/dL — AB (ref <150)
Total CHOL/HDL Ratio: 4.3 Ratio
VLDL: 41 mg/dL — AB (ref 0–40)

## 2013-10-29 LAB — COMPREHENSIVE METABOLIC PANEL
ALBUMIN: 5 g/dL (ref 3.5–5.2)
ALK PHOS: 75 U/L (ref 39–117)
ALT: 14 U/L (ref 0–53)
AST: 18 U/L (ref 0–37)
BILIRUBIN TOTAL: 0.5 mg/dL (ref 0.2–1.2)
BUN: 13 mg/dL (ref 6–23)
CO2: 26 mEq/L (ref 19–32)
Calcium: 9.7 mg/dL (ref 8.4–10.5)
Chloride: 101 mEq/L (ref 96–112)
Creat: 0.89 mg/dL (ref 0.50–1.35)
Glucose, Bld: 85 mg/dL (ref 70–99)
POTASSIUM: 4.4 meq/L (ref 3.5–5.3)
SODIUM: 138 meq/L (ref 135–145)
TOTAL PROTEIN: 7.1 g/dL (ref 6.0–8.3)

## 2013-11-04 ENCOUNTER — Telehealth: Payer: Self-pay

## 2013-11-26 ENCOUNTER — Other Ambulatory Visit: Payer: Self-pay

## 2013-11-26 ENCOUNTER — Other Ambulatory Visit: Payer: Self-pay | Admitting: Physician Assistant

## 2013-11-26 DIAGNOSIS — L409 Psoriasis, unspecified: Secondary | ICD-10-CM

## 2013-11-26 MED ORDER — TRIAMCINOLONE ACETONIDE 0.025 % EX LOTN: TOPICAL_LOTION | CUTANEOUS | Status: DC

## 2013-11-29 ENCOUNTER — Ambulatory Visit (INDEPENDENT_AMBULATORY_CARE_PROVIDER_SITE_OTHER): Payer: No Typology Code available for payment source | Admitting: Physician Assistant

## 2013-11-29 VITALS — BP 132/80 | HR 110 | Temp 98.3°F | Resp 18 | Ht 71.0 in | Wt 198.0 lb

## 2013-11-29 DIAGNOSIS — J329 Chronic sinusitis, unspecified: Secondary | ICD-10-CM

## 2013-11-29 DIAGNOSIS — L409 Psoriasis, unspecified: Secondary | ICD-10-CM | POA: Insufficient documentation

## 2013-11-29 MED ORDER — AMOXICILLIN 875 MG PO TABS: 875.0000 mg | ORAL_TABLET | Freq: Two times a day (BID) | ORAL | Status: DC

## 2013-11-29 NOTE — Progress Notes (Signed)
   Subjective:    Patient ID: Walter Evans, male    DOB: Sep 28, 1980, 33 y.o.   MRN: [id number]  HPI Pt presents to clinic with continued sinus pressure for the last several months - 4 days ago he started with dry cough from a tickle in his throat and then yesterday he started to have a sore throat.  This is typically what has happened to him when he has a sinus infection - several years he had a sinus infection for a year before anyone diagnosed his PND as sinus infection. He thinks he needs a sinus evaluation but has not gotten around to it.  He has been on nasal steroid but he did not think that it helped at all.  OTC meds - Mucinex D has been on for months.  Review of Systems  Constitutional: Positive for chills. Negative for fever.  HENT: Positive for congestion, postnasal drip and sore throat.   Respiratory: Positive for cough.   Gastrointestinal: Negative for nausea, vomiting and diarrhea.  Allergic/Immunologic: Negative for environmental allergies.  Psychiatric/Behavioral: Positive for sleep disturbance (2nd to sore throat).       Objective:   Physical Exam  Vitals reviewed. Constitutional: He is oriented to person, place, and time. He appears well-developed and well-nourished.  HENT:  Head: Normocephalic and atraumatic.  Right Ear: Hearing, tympanic membrane, external ear and ear canal normal.  Left Ear: Hearing, tympanic membrane, external ear and ear canal normal.  Nose: Mucosal edema (red - more swollen on the right side) present.  Mouth/Throat: Uvula is midline, oropharynx is clear and moist and mucous membranes are normal.  Eyes: Conjunctivae are normal.  Cardiovascular: Normal rate, regular rhythm and normal heart sounds.   No murmur heard. Pulmonary/Chest: Effort normal and breath sounds normal. He has no wheezes.  Neurological: He is alert and oriented to person, place, and time.  Skin: Skin is warm and dry.  Psychiatric: He has a normal mood and affect. His  behavior is normal. Judgment and thought content normal.       Assessment & Plan:   Sinus infection - Plan: amoxicillin (AMOXIL) 875 MG tablet - d/w pt to stop his sudafed while he is on he abx to help get the mucus out  Windell Hummingbird PA-C  [facility] 11/29/2013 8:21 AM

## 2013-12-30 ENCOUNTER — Ambulatory Visit (INDEPENDENT_AMBULATORY_CARE_PROVIDER_SITE_OTHER): Payer: No Typology Code available for payment source | Admitting: Family Medicine

## 2013-12-30 VITALS — BP 127/77 | HR 80 | Temp 98.4°F | Resp 16 | Ht 71.0 in | Wt 199.0 lb

## 2013-12-30 DIAGNOSIS — R0982 Postnasal drip: Secondary | ICD-10-CM

## 2013-12-30 DIAGNOSIS — R6882 Decreased libido: Secondary | ICD-10-CM

## 2013-12-30 DIAGNOSIS — R413 Other amnesia: Secondary | ICD-10-CM

## 2013-12-30 MED ORDER — IPRATROPIUM BROMIDE 0.03 % NA SOLN: 2.0000 | Freq: Four times a day (QID) | NASAL | Status: DC

## 2013-12-30 NOTE — Progress Notes (Signed)
[facility] [address], [place] [address] [phone]  Date:  12/30/2013   Name:  Walter Evans   DOB:  04/19/1981   MRN:  [id number]  PCP:  No PCP Per Patient    Chief Complaint: Referral and Sinus Problem   History of Present Illness:  Walter Evans is a 33 y.o. very pleasant male patient who presents with the following:  He is here today with a couple of concerns.   He has noted some issues with his memory for a few years, and his wife is concerned.  He would like to be referred to [facility] neurological. He states last week he forgot about someone who has lived in their home for a long time- "I completely forgot about them and who they were for about 5 minutes.  My memory is a warehouse with a hole in the floor."  He notes that he may go into a room and forget why he is there.  He is not aware of any history of memory loss or dementia in his family.   He feels that his sinuses are better but still sometimes problematic.  He recently abx which did help.  "I will have a sinus infection for months at a time."  He has noted some trouble with his libido "after I take my valtrex."  However he takes it every day; he has been doing so for about 18 months.  Prior to that he just took it as needed for flare ups- however at that time he had more frequent flare-ups.   He notes that his libido has not been as good for about 18 months, which   Also has compleint of persistent sinus isuses,  Patient Active Problem List   Diagnosis Date Noted  . Psoriasis 11/29/2013  . Sinusitis, chronic 01/03/2012  . 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 Psoriasis   . HSV-2 (herpes simplex virus 2) infection   . MRSA (methicillin resistant Staphylococcus aureus)   . Allergy   . Arthritis     No past surgical history on file.  History   Substance Use Topics  . Smoking status: Never Smoker   . Smokeless tobacco: Not on file  . Alcohol Use: 1.2 oz/week    2 Glasses of wine per week    Family History  Problem Relation Age of Onset  . Hypertension Mother   . Cancer Mother   . Heart disease Mother   . Hypertension Father   . Cancer Father     and tumor -benign  . Hyperlipidemia Father   . Cancer Maternal Grandmother   . Heart disease Maternal Grandmother   . Heart disease Maternal Grandfather   . Cancer Paternal Grandmother     Allergies  Allergen Reactions  . Shellfish Allergy     unknown    Medication list has been reviewed and updated.  Current Outpatient Prescriptions on File Prior to Visit  Medication Sig Dispense Refill  . triamcinolone (KENALOG) 0.025 % cream APPLY SPARINGLY TO AFFECTED AREAS AS DIRECTED.  80 g  9  . Triamcinolone Acetonide 0.025 % LOTN Apply sparingly to affected area prn  60 mL  9  . valACYclovir (VALTREX) 500 MG tablet Take 1 tablet (500 mg total) by mouth daily.  30 tablet  11   No current facility-administered medications on file  prior to visit.    Review of Systems:  As per HPI- otherwise negative.   Physical Examination: Filed Vitals:   12/30/13 1200  BP: 127/77  Pulse: 80  Temp: 98.4 F (36.9 C)  Resp: 16   Filed Vitals:   12/30/13 1200  Height: 5\' 11"  (1.803 m)  Weight: 199 lb (90.266 kg)   Body mass index is 27.77 kg/(m^2). Ideal Body Weight: Weight in (lb) to have BMI = 25: 178.9  GEN: WDWN, NAD, Non-toxic, A & O x 3, looks well HEENT: Atraumatic, Normocephalic. Neck supple. No masses, No LAD.  Bilateral TM wnl, oropharynx normal.  PEERL,EOMI.   Ears and Nose: No external deformity. CV: RRR, No M/G/R. No JVD. No thrill. No extra heart sounds. PULM: CTA B, no wheezes, crackles, rhonchi. No retractions. No resp. distress. No accessory muscle use. ABD: S, NT, ND, +BS. No rebound. No HSM. EXTR: No c/c/e NEURO Normal gait.  PSYCH: Normally interactive.  Conversant. Not depressed or anxious appearing.  Calm demeanor.    Assessment and Plan: Memory loss - Plan: Ambulatory referral to Neurology  Low libido - Plan: Testosterone, Testosterone, free  PND (post-nasal drip) - Plan: ipratropium (ATROVENT) 0.03 % nasal spray  Referral to neurology per his request.  No evidence of abnormal mentation on exam Low libido: I am not sure if this is related to his valtrex.  Will check T level for him in the am.  However given his age I hesitate to use testosterone replacement in any case.  May try having him take his valtrex every other day.   Persistent PND- will try atrovent nasal  Signed Lamar Blinks, MD

## 2013-12-31 ENCOUNTER — Other Ambulatory Visit (INDEPENDENT_AMBULATORY_CARE_PROVIDER_SITE_OTHER): Payer: No Typology Code available for payment source

## 2013-12-31 DIAGNOSIS — R6882 Decreased libido: Secondary | ICD-10-CM

## 2013-12-31 LAB — TESTOSTERONE, FREE

## 2014-01-01 ENCOUNTER — Ambulatory Visit (INDEPENDENT_AMBULATORY_CARE_PROVIDER_SITE_OTHER): Payer: No Typology Code available for payment source | Admitting: Neurology

## 2014-01-01 ENCOUNTER — Encounter: Payer: Self-pay | Admitting: Neurology

## 2014-01-01 VITALS — BP 132/75 | HR 82 | Ht 71.5 in | Wt 202.0 lb

## 2014-01-01 DIAGNOSIS — R4189 Other symptoms and signs involving cognitive functions and awareness: Secondary | ICD-10-CM | POA: Insufficient documentation

## 2014-01-01 DIAGNOSIS — F09 Unspecified mental disorder due to known physiological condition: Secondary | ICD-10-CM

## 2014-01-01 LAB — TESTOSTERONE, FREE, TOTAL, SHBG
SEX HORMONE BINDING: 25 nmol/L (ref 13–71)
TESTOSTERONE-% FREE: 2.3 % (ref 1.6–2.9)
Testosterone, Free: 84.1 pg/mL (ref 47.0–244.0)
Testosterone: 360 ng/dL (ref 300–890)

## 2014-01-01 NOTE — Patient Instructions (Signed)
Overall you are doing fairly well but I do want to suggest a few things today:   Remember to drink plenty of fluid, eat healthy meals and do not skip any meals. Try to eat protein with a every meal and eat a healthy snack such as fruit or nuts in between meals. Try to keep a regular sleep-wake schedule and try to exercise daily, particularly in the form of walking, 20-30 minutes a day, if you can.   As far as diagnostic testing:  1)Please have some blood work checked today  I will follow up with you once the blood work is completed. If normal we may consider a MRI of the brain and/or formal cognitive testing with neuro-psychology. Please call us with any interim questions, concerns, problems, updates or refill requests.   My clinical assistant and will answer any of your questions and relay your messages to me and also relay most of my messages to you.   Our phone number is [phone]. We also have an after hours call service for urgent matters and there is a physician on-call for urgent questions. For any emergencies you know to call 911 or go to the nearest emergency room

## 2014-01-01 NOTE — Progress Notes (Signed)
[facility]    Provider:  Dr Janann Colonel Referring Provider: Lorelei Pont Gay Filler, MD Primary Care Physician:  No PCP Per Patient  CC:  Memory decline  HPI:  Walter Evans is a 33 y.o. male here as a referral from Dr. Lorelei Pont for cognitive decline  Notes started years ago, feels it has been getting progressively worse with time. Notes it is mainly a problem with short term memory. Has a lot of trouble with word finding, trouble getting words out. Has difficulty recalling what he is doing on a daily basis, difficulty recalling why he went into a room, difficulty recalling names. Currently an owner of a business, notes some difficulty and initiation with the business. Has bills on automatic bill pay so he doesn't have to try to remember to pay bills. Notes some poor remote memory. Denies any difficulty with sleep. Notes history of depression/anxiety. Has chronic pain in his right knee. Has some generalized lack of energy. Social EtOH use.   No known family history of cognitive decline or neurodegenerative process.   Review of Systems: Out of a complete 14 system review, the patient complains of only the following symptoms, and all other reviewed systems are negative. + cough, snoring, confusion, depression, joint pain, ringing in ears  History   Social History  . Marital Status: Married    Spouse Name: [name]     Number of Children: 0  . Years of Education: 12   Occupational History  . Not on file.   Social History Main Topics  . Smoking status: Never Smoker   . Smokeless tobacco: Not on file  . Alcohol Use: 1.2 oz/week    2 Glasses of wine per week  . Drug Use: No  . Sexual Activity: Yes   Other Topics Concern  . Not on file   Social History Narrative   Patient lives at home with wife [name].   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   Patient has no children.    Patient is ambidextrous     Family History  Problem Relation Age of Onset  . Hypertension  Mother   . Cancer Mother   . Heart disease Mother   . Hypertension Father   . Cancer Father     and tumor -benign  . Hyperlipidemia Father   . Cancer Maternal Grandmother   . Heart disease Maternal Grandmother   . Heart disease Maternal Grandfather   . Cancer Paternal Grandmother     Past Medical History  Diagnosis Date  . Psoriasis   . HSV-2 (herpes simplex virus 2) infection   . MRSA (methicillin resistant Staphylococcus aureus)   . Allergy   . Arthritis     Past Surgical History  Procedure Laterality Date  . Tonsillectomy  2007  . Wisdom tooth extraction  2003    Current Outpatient Prescriptions  Medication Sig Dispense Refill  . ipratropium (ATROVENT) 0.03 % nasal spray Place 2 sprays into the nose 4 (four) times daily.  30 mL  6  . triamcinolone (KENALOG) 0.025 % cream APPLY SPARINGLY TO AFFECTED AREAS AS DIRECTED.  80 g  9  . Triamcinolone Acetonide 0.025 % LOTN Apply sparingly to affected area prn  60 mL  9  . valACYclovir (VALTREX) 500 MG tablet Take 1 tablet (500 mg total) by mouth daily.  30 tablet  11   No current facility-administered medications for this visit.    Allergies as of 01/01/2014 - Review Complete 01/01/2014  Allergen Reaction  Noted  . Shellfish allergy  01/03/2012    Vitals: BP 132/75  Pulse 82  Ht 5' 11.5" (1.816 m)  Wt 202 lb (91.627 kg)  BMI 27.78 kg/m2 Last Weight:  Wt Readings from Last 1 Encounters:  01/01/14 202 lb (91.627 kg)   Last Height:   Ht Readings from Last 1 Encounters:  01/01/14 5' 11.5" (1.816 m)     Physical exam: Exam: Gen: NAD, conversant Eyes: anicteric sclerae, moist conjunctivae HENT: Atraumatic, oropharynx clear Neck: Trachea midline; supple,  Lungs: CTA, no wheezing, rales, rhonic                          CV: RRR, no MRG Abdomen: Soft, non-tender;  Extremities: No peripheral edema  Skin: Normal temperature, no rash,  Psych: Appropriate affect, pleasant  Neuro: MS: MOCA 25/30  CN: PERRL, EOMI  no nystagmus, no ptosis, sensation intact to LT V1-V3 bilat, face symmetric, no weakness, hearing grossly intact, palate elevates symmetrically, shoulder shrug 5/5 bilat,  tongue protrudes midline, no fasiculations noted.  Motor: normal bulk and tone Strength: 5/5  In all extremities  Coord: rapid alternating and point-to-point (FNF, HTS) movements intact.  Reflexes: symmetrical, bilat downgoing toes  Sens: LT intact in all extremities  Gait: slightly antalgic favoring right knee, uses cane for support   Assessment:  After physical and neurologic examination, review of laboratory studies, imaging, neurophysiology testing and pre-existing records, assessment will be reviewed on the problem list.  Plan:  Treatment plan and additional workup will be reviewed under Problem List.  1)Cognitive decline 2)Depression/anxiety  32y/o gentleman presenting for initial evaluation of cognitive decline. Symptoms have been getting progressively worse over the past few years. Unclear etiology of symptoms. Physical exam is overall unremarkable with exception of MOCA 25/30. Will check B12, MMA, TSH. If normal will consider brain MRI and/or formal cognitive testing with neuro-psychology. Would question if depression/anxiety playing a role in his memory trouble, though this will be a diagnosis of exclusion. Will follow up once blood work completed.   Jim Like, DO  [facility] [address] [place], [place] [address]  Phone [phone] Fax [phone]

## 2014-01-02 ENCOUNTER — Telehealth: Payer: Self-pay | Admitting: Family Medicine

## 2014-01-02 ENCOUNTER — Encounter: Payer: Self-pay | Admitting: Family Medicine

## 2014-01-02 NOTE — Telephone Encounter (Signed)
Called and gave him his labs- testosterone is normal.  Suggested that he get through his neurology work-up first, and if this problem remains unanswered I can send him to urology

## 2014-01-03 LAB — TSH: TSH: 1.38 u[IU]/mL (ref 0.450–4.500)

## 2014-01-03 LAB — VITAMIN B12: Vitamin B-12: 902 pg/mL (ref 211–946)

## 2014-01-03 LAB — METHYLMALONIC ACID, SERUM: Methylmalonic Acid: 151 nmol/L (ref 0–378)

## 2014-01-06 ENCOUNTER — Other Ambulatory Visit: Payer: Self-pay | Admitting: Neurology

## 2014-01-06 ENCOUNTER — Telehealth: Payer: Self-pay | Admitting: *Deleted

## 2014-01-06 DIAGNOSIS — R4189 Other symptoms and signs involving cognitive functions and awareness: Secondary | ICD-10-CM

## 2014-01-06 NOTE — Telephone Encounter (Signed)
Called patient left a message that labs were normal and that Dr. Janann Colonel will be ordering and MRI of the brain. Told patient someone would call him to get that scheduled once his ins approves it. Any questions before then he is to call the office.

## 2014-01-06 NOTE — Telephone Encounter (Signed)
Message copied by Vivi Barrack on Mon Jan 06, 2014  3:48 PM ------      Message from: Drema Dallas      Created: Mon Jan 06, 2014  2:48 PM       Please let him know his labs were normal. I would like him to get a MRI of the brain. He will be called to schedule this. ------

## 2014-01-09 ENCOUNTER — Ambulatory Visit
Admission: RE | Admit: 2014-01-09 | Discharge: 2014-01-09 | Disposition: A | Discharge status: Home / Self Care | Payer: No Typology Code available for payment source | Source: Ambulatory Visit | Attending: Neurology | Admitting: Neurology

## 2014-01-09 DIAGNOSIS — R4189 Other symptoms and signs involving cognitive functions and awareness: Secondary | ICD-10-CM

## 2014-01-09 DIAGNOSIS — R413 Other amnesia: Secondary | ICD-10-CM

## 2014-01-14 ENCOUNTER — Telehealth: Payer: Self-pay | Admitting: *Deleted

## 2014-01-14 NOTE — Telephone Encounter (Signed)
Spoke to patient and he is aware that the MRI was normal.

## 2014-01-21 ENCOUNTER — Other Ambulatory Visit: Payer: Self-pay | Admitting: Neurology

## 2014-01-21 DIAGNOSIS — R4189 Other symptoms and signs involving cognitive functions and awareness: Secondary | ICD-10-CM

## 2014-04-23 DIAGNOSIS — R413 Other amnesia: Secondary | ICD-10-CM

## 2014-04-29 ENCOUNTER — Telehealth: Payer: Self-pay | Admitting: Neurology

## 2014-04-29 DIAGNOSIS — R413 Other amnesia: Secondary | ICD-10-CM

## 2014-04-29 NOTE — Telephone Encounter (Signed)
Patient is a former patient of Dr. Janann Colonel. He has been to Neuro [facility] who suggest he come back for a follow up.   He does not care who he is put with but would like it for 3 or 4 weeks out and give him a call. [phone]. It is okay to leave a message at that number.

## 2014-04-29 NOTE — Telephone Encounter (Signed)
Okay for this patient followup with this office. I will be happy to see him.

## 2014-04-30 NOTE — Telephone Encounter (Signed)
Patient has been scheduled for 05-19-14, to wait for Dr. Monico Hoar report.

## 2014-04-30 NOTE — Telephone Encounter (Signed)
Spoke to patient's wife and she asked that I call back in morning to schedule appointment.

## 2014-05-08 ENCOUNTER — Encounter: Payer: Self-pay | Admitting: Family Medicine

## 2014-05-08 DIAGNOSIS — R413 Other amnesia: Secondary | ICD-10-CM | POA: Insufficient documentation

## 2014-05-09 ENCOUNTER — Telehealth: Payer: Self-pay | Admitting: Neurology

## 2014-05-09 NOTE — Telephone Encounter (Signed)
I called the patient. The patient has had neuropsychological testing. This indicates some slowing of cognitive processing, some evidence of ADD, antidepressants were recommended. The results of the MRI of the brain and blood work were also reviewed. The studies were unremarkable. The patient indicates that he never heard the results of these studies, but documentation of callbacks are in the computer indicating that the blood work and MRI reports were called back to the patient. He indicates that the MRI technician gave him the result of the MRI.  After going through the results, the patient indicated that he no longer needs to follow-up on November 9. I will cancel this appointment.

## 2014-05-19 ENCOUNTER — Ambulatory Visit: Payer: Self-pay | Admitting: Neurology

## 2014-09-19 ENCOUNTER — Ambulatory Visit (INDEPENDENT_AMBULATORY_CARE_PROVIDER_SITE_OTHER): Payer: No Typology Code available for payment source | Admitting: Internal Medicine

## 2014-09-19 VITALS — BP 128/82 | HR 87 | Temp 98.0°F | Resp 17 | Ht 72.5 in | Wt 212.0 lb

## 2014-09-19 DIAGNOSIS — R059 Cough, unspecified: Secondary | ICD-10-CM

## 2014-09-19 DIAGNOSIS — J0141 Acute recurrent pansinusitis: Secondary | ICD-10-CM | POA: Diagnosis not present

## 2014-09-19 DIAGNOSIS — R05 Cough: Secondary | ICD-10-CM | POA: Diagnosis not present

## 2014-09-19 MED ORDER — AMOXICILLIN 500 MG PO CAPS: 1000.0000 mg | ORAL_CAPSULE | Freq: Two times a day (BID) | ORAL | Status: DC

## 2014-09-19 NOTE — Patient Instructions (Signed)
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
## 2014-10-02 ENCOUNTER — Ambulatory Visit (INDEPENDENT_AMBULATORY_CARE_PROVIDER_SITE_OTHER): Payer: No Typology Code available for payment source | Admitting: Physician Assistant

## 2014-10-02 ENCOUNTER — Encounter: Payer: Self-pay | Admitting: Physician Assistant

## 2014-10-02 VITALS — BP 120/82 | HR 76 | Temp 98.3°F | Resp 16 | Ht 72.0 in | Wt 213.8 lb

## 2014-10-02 DIAGNOSIS — N522 Drug-induced erectile dysfunction: Secondary | ICD-10-CM

## 2014-10-02 MED ORDER — TADALAFIL 5 MG PO TABS: 2.5000 mg | ORAL_TABLET | ORAL | Status: DC | PRN

## 2014-10-02 NOTE — Progress Notes (Signed)
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  SUBJECTIVE  Chief complaint: Medication Refill   History of present illness: Walter Evans is 34 y.o. well appearing male with an impressive mustache presenting for erectile dysfunction 2/2 daily valacyclovir dosing.  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 Without medication therapy he reports getting one to two outbreaks each month.  With medical therapy he denies outbreaks.  He contracted the illness from his wife, who is also taking valacyclovir.  He reports that the daily to every other dosing causes him to have erectile dysfunction, and that this side effect is listed in the manufacturer insert.  He reports his relationship with his wife is excellent.  He is requesting a prescription to help him with his ED.   He  has a past medical history of Psoriasis; HSV-2 (herpes simplex virus 2) infection; MRSA (methicillin resistant Staphylococcus aureus); Allergy; and Arthritis.    He has a current medication list which includes the following prescription(s): amoxicillin, ipratropium, triamcinolone, triamcinolone acetonide, valacyclovir, and tadalafil.  Walter Evans is allergic to shellfish allergy. He  reports that he has never smoked. He does not have any smokeless tobacco history on file. He reports that he drinks about 1.2 oz of alcohol per week. He reports that he does not use illicit drugs. He  reports that he currently engages in sexual activity. The patient  has past surgical history that includes Tonsillectomy (2007) and Wisdom tooth extraction (2003).  His family history includes Cancer in his father, maternal grandmother, mother, and paternal grandmother; Heart disease in his maternal grandfather,  maternal grandmother, and mother; Hyperlipidemia in his father; Hypertension in his father and mother.  Review of Systems  Constitutional: Negative.   HENT: Negative.   Eyes: Negative.   Respiratory: Negative.   Cardiovascular: Negative.   Gastrointestinal: Negative.   Genitourinary: Negative.   Musculoskeletal: Negative.   Skin: Negative.   Neurological: Negative.   Endo/Heme/Allergies: Negative.   Psychiatric/Behavioral: Negative.     OBJECTIVE  His  height is 6' (1.829 m) and weight is 213 lb 12.8 oz (96.979 kg). His oral temperature is 98.3 F (36.8 C). His blood pressure is 120/82 and his pulse is 76. His respiration is 16 and oxygen saturation is 100%.  The patient's body mass index is 28.99 kg/(m^2).  Physical Exam  Vitals reviewed. Constitutional: He is oriented to person, place, and time. He appears well-developed and well-nourished. No distress.  HENT:  Head: Normocephalic.  Cardiovascular: Normal rate and regular rhythm.   Respiratory: Effort normal and breath sounds normal.  GI: Soft. Bowel sounds are normal.  Musculoskeletal: Normal range of motion.  Neurological: He is alert and oriented to person, place, and time. No cranial nerve deficit.  Skin: Skin is warm and dry. He is not diaphoretic.  Psychiatric: He has a normal mood and affect.    No results found for this or any previous visit (from the past 24 hour(s)).  ASSESSMENT & PLAN  Walter Evans was seen today for medication refill.  Diagnoses and all orders for this visit:  Drug-induced erectile dysfunction Orders: -     tadalafil (CIALIS) 5 MG tablet; Take 0.5-1 tablets (2.5-5 mg total) by mouth every other day as needed for erectile dysfunction.  The patient was advised to call or come back to clinic if he does not see an improvement in symptoms, or worsens with the above plan.   Philis Fendt, MHS, PA-C [facility] 10/02/2014 5:15 PM

## 2015-06-02 ENCOUNTER — Encounter: Payer: Self-pay | Admitting: Family Medicine

## 2015-06-02 ENCOUNTER — Ambulatory Visit (INDEPENDENT_AMBULATORY_CARE_PROVIDER_SITE_OTHER): Payer: No Typology Code available for payment source | Admitting: Family Medicine

## 2015-06-02 VITALS — BP 130/85 | HR 76 | Temp 98.2°F | Resp 16 | Wt 201.0 lb

## 2015-06-02 DIAGNOSIS — A6000 Herpesviral infection of urogenital system, unspecified: Secondary | ICD-10-CM

## 2015-06-02 DIAGNOSIS — A609 Anogenital herpesviral infection, unspecified: Secondary | ICD-10-CM | POA: Diagnosis not present

## 2015-06-02 DIAGNOSIS — M25561 Pain in right knee: Secondary | ICD-10-CM | POA: Diagnosis not present

## 2015-06-02 DIAGNOSIS — G8929 Other chronic pain: Secondary | ICD-10-CM

## 2015-06-02 MED ORDER — VALACYCLOVIR HCL 500 MG PO TABS: 500.0000 mg | ORAL_TABLET | Freq: Every day | ORAL | Status: DC

## 2015-06-02 NOTE — Progress Notes (Signed)
   Subjective:    Patient ID: Walter Evans, male    DOB: 02-Mar-1981, 34 y.o.   MRN: [id number]  HPI This is a 34 yo male who presents today for valtrex refill and handicap placard renewal   He sees Dr. Nelva Bush for his chronic knee pain and altered gait. He always uses a cane for walking and is limited in his ability to walk very far without pain.   Last HSV2 outbreak July. He takes valtrex 500 mg daily with good suppression.   Past Medical History  Diagnosis Date  . Psoriasis   . HSV-2 (herpes simplex virus 2) infection   . MRSA (methicillin resistant Staphylococcus aureus)   . Allergy   . Arthritis    Past Surgical History  Procedure Laterality Date  . Tonsillectomy  2007  . Wisdom tooth extraction  2003   Family History  Problem Relation Age of Onset  . Hypertension Mother   . Cancer Mother   . Heart disease Mother   . Hypertension Father   . Cancer Father     and tumor -benign  . Hyperlipidemia Father   . Cancer Maternal Grandmother   . Heart disease Maternal Grandmother   . Heart disease Maternal Grandfather   . Cancer Paternal Grandmother    Social History  Substance Use Topics  . Smoking status: Never Smoker   . Smokeless tobacco: None  . Alcohol Use: 1.2 oz/week    2 Glasses of wine per week   Review of Systems Per HPI    Objective:   Physical Exam Physical Exam  Vitals reviewed. Constitutional: Oriented to person, place, and time. Appears well-developed and well-nourished.  HENT:  Head: Normocephalic and atraumatic.  Eyes: Conjunctivae are normal.  Neck: Normal range of motion. Neck supple.  Cardiovascular: Normal rate.   Pulmonary/Chest: Effort normal.  Musculoskeletal: Arthralgic gait, using cane Neurological: Alert and oriented to person, place, and time.  Skin: Skin is warm and dry.  Psychiatric: Normal mood and affect. Behavior is normal. Judgment and thought content normal.  BP 130/85 mmHg  Pulse 76  Temp(Src) 98.2 F (36.8 C) (Oral)   Resp 16  Wt 201 lb (91.173 kg) Wt Readings from Last 3 Encounters:  06/02/15 201 lb (91.173 kg)  10/02/14 213 lb 12.8 oz (96.979 kg)  09/19/14 212 lb (96.163 kg)   Depression screen [facility] 2/9 06/02/2015 10/02/2014 10/28/2013 09/23/2013  Decreased Interest 0 0 1 0  Down, Depressed, Hopeless 0 0 1 0  PHQ - 2 Score 0 0 2 0  Altered sleeping - - 0 -  Tired, decreased energy - - 1 -  Change in appetite - - 0 -  Feeling bad or failure about yourself  - - 0 -  Trouble concentrating - - 0 -  Moving slowly or fidgety/restless - - 0 -  Suicidal thoughts - - 0 -  PHQ-9 Score - - 3 -      Assessment & Plan:  1. Recurrent genital HSV (herpes simplex virus) infection - valACYclovir (VALTREX) 500 MG tablet; Take 1 tablet (500 mg total) by mouth daily.  Dispense: 90 tablet; Refill: 3  2. Chronic knee pain, right - He plans to continue to see Dr. Nelva Bush - permit completed for handicap parking placard  - follow up PRN  Clarene Reamer, FNP-BC  [facility]  06/07/2015 12:13 PM

## 2015-12-06 DIAGNOSIS — M5126 Other intervertebral disc displacement, lumbar region: Secondary | ICD-10-CM | POA: Insufficient documentation

## 2016-02-06 ENCOUNTER — Ambulatory Visit (INDEPENDENT_AMBULATORY_CARE_PROVIDER_SITE_OTHER): Payer: No Typology Code available for payment source | Admitting: Physician Assistant

## 2016-02-06 ENCOUNTER — Telehealth: Payer: Self-pay | Admitting: Radiology

## 2016-02-06 VITALS — BP 136/84 | HR 80 | Temp 98.1°F | Resp 16 | Ht 71.25 in | Wt 204.1 lb

## 2016-02-06 DIAGNOSIS — A6000 Herpesviral infection of urogenital system, unspecified: Secondary | ICD-10-CM

## 2016-02-06 DIAGNOSIS — M545 Low back pain, unspecified: Secondary | ICD-10-CM | POA: Insufficient documentation

## 2016-02-06 DIAGNOSIS — Z113 Encounter for screening for infections with a predominantly sexual mode of transmission: Secondary | ICD-10-CM | POA: Diagnosis not present

## 2016-02-06 DIAGNOSIS — Z23 Encounter for immunization: Secondary | ICD-10-CM | POA: Diagnosis not present

## 2016-02-06 DIAGNOSIS — Z114 Encounter for screening for human immunodeficiency virus [HIV]: Secondary | ICD-10-CM | POA: Diagnosis not present

## 2016-02-06 DIAGNOSIS — Z139 Encounter for screening, unspecified: Secondary | ICD-10-CM | POA: Diagnosis not present

## 2016-02-06 DIAGNOSIS — Z1329 Encounter for screening for other suspected endocrine disorder: Secondary | ICD-10-CM | POA: Diagnosis not present

## 2016-02-06 DIAGNOSIS — Z130 Encounter for screening for diseases of the blood and blood-forming organs and certain disorders involving the immune mechanism: Secondary | ICD-10-CM

## 2016-02-06 DIAGNOSIS — A609 Anogenital herpesviral infection, unspecified: Secondary | ICD-10-CM | POA: Diagnosis not present

## 2016-02-06 DIAGNOSIS — M25561 Pain in right knee: Secondary | ICD-10-CM

## 2016-02-06 DIAGNOSIS — Z0000 Encounter for general adult medical examination without abnormal findings: Secondary | ICD-10-CM

## 2016-02-06 DIAGNOSIS — L409 Psoriasis, unspecified: Secondary | ICD-10-CM

## 2016-02-06 DIAGNOSIS — Z1389 Encounter for screening for other disorder: Secondary | ICD-10-CM

## 2016-02-06 DIAGNOSIS — Z6828 Body mass index (BMI) 28.0-28.9, adult: Secondary | ICD-10-CM

## 2016-02-06 DIAGNOSIS — Z13220 Encounter for screening for lipoid disorders: Secondary | ICD-10-CM

## 2016-02-06 DIAGNOSIS — Z13228 Encounter for screening for other metabolic disorders: Secondary | ICD-10-CM | POA: Diagnosis not present

## 2016-02-06 DIAGNOSIS — G8929 Other chronic pain: Secondary | ICD-10-CM

## 2016-02-06 LAB — POCT URINALYSIS DIP (MANUAL ENTRY)
Bilirubin, UA: NEGATIVE
Blood, UA: NEGATIVE
Glucose, UA: NEGATIVE
Ketones, POC UA: NEGATIVE
LEUKOCYTES UA: NEGATIVE
NITRITE UA: NEGATIVE
PH UA: 7
PROTEIN UA: NEGATIVE
Spec Grav, UA: 1.015
Urobilinogen, UA: 0.2

## 2016-02-06 LAB — POC MICROSCOPIC URINALYSIS (UMFC)

## 2016-02-06 MED ORDER — TRIAMCINOLONE ACETONIDE 0.025 % EX CREA: TOPICAL_CREAM | CUTANEOUS | 9 refills | Status: DC

## 2016-02-06 MED ORDER — TRIAMCINOLONE ACETONIDE 0.025 % EX LOTN: TOPICAL_LOTION | CUTANEOUS | 9 refills | Status: DC

## 2016-02-06 MED ORDER — VALACYCLOVIR HCL 500 MG PO TABS: 500.0000 mg | ORAL_TABLET | Freq: Every day | ORAL | 3 refills | Status: DC

## 2016-02-06 NOTE — Patient Instructions (Signed)
     IF you received an x-ray today, you will receive an invoice from [facility]. Please contact [facility] at [phone] with questions or concerns regarding your invoice.   IF you received labwork today, you will receive an invoice from [facility]. Please contact [facility] at [phone] with questions or concerns regarding your invoice.   Our billing staff will not be able to assist you with questions regarding bills from these companies.  You will be contacted with the lab results as soon as they are available. The fastest way to get your results is to activate your My Chart account. Instructions are located on the last page of this paperwork. If you have not heard from us regarding the results in 2 weeks, please contact this office.      

## 2016-02-06 NOTE — Telephone Encounter (Signed)
Patient was here today for his physical. He needs a Tdap, we are out, when our shipment arrives, will you call him to have him come in for it.

## 2016-02-06 NOTE — Progress Notes (Signed)
Patient ID: Walter Evans, male    DOB: 02-May-1981, 35 y.o.   MRN: [id number]  PCP: No PCP Per Patient  Chief Complaint  Patient presents with  . Annual Exam    Subjective:   HPI: Presents for Annual Wellness Exam.  Needs medication refills. Needs Tdap.  Sexually active with 2 partners, including his wife. Bisexual. Agrees to STI screening when offered, but states that the other partner has been tested and is negative. His wife is here today for wellness exam, as well.  He uses valacyclovir intermittently, when he thinks he may be getting an HSV outbreak. He doesn't use it daily to suppress outbreaks and reduce risk of transmission because it's expensive and "no insurance covers that drug."  He is being treated for knee pain and memory issues by neurology in [place], on the recommendation of a family friend who practices neurosurgery.   Patient Active Problem List   Diagnosis Date Noted  . Memory loss 05/08/2014  . Cognitive decline 01/01/2014  . Psoriasis 11/29/2013  . Sinusitis, chronic 01/03/2012  . 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 Allergy   . Arthritis    RIGHT knee and back  . Back pain    L3-L4  . HSV-2 (herpes simplex virus 2) infection   . MRSA (methicillin resistant Staphylococcus aureus)   . Psoriasis      Prior to Admission medications   Medication Sig Start Date End Date Taking? Authorizing Provider  triamcinolone (KENALOG) 0.025 % cream APPLY SPARINGLY TO AFFECTED AREAS AS DIRECTED. 02/06/16  Yes Karisha Marlin, PA-C  Triamcinolone Acetonide 0.025 % LOTN Apply sparingly to affected area prn 02/06/16  Yes Zameer Borman, PA-C  valACYclovir (VALTREX) 500 MG tablet Take 1 tablet (500 mg total) by mouth daily. 02/06/16  Yes Harrison Mons, PA-C    Allergies  Allergen Reactions  . Shellfish Allergy     unknown    Past  Surgical History:  Procedure Laterality Date  . TONSILLECTOMY  2007  . WISDOM TOOTH EXTRACTION  2003    Family History  Problem Relation Age of Onset  . Hypertension Mother   . Cancer Mother 23    colon cancer  . Heart disease Mother   . Hypertension Father   . Cancer Father     "a benign form of cancer that causes tumors in the ear."  . Hyperlipidemia Father   . Cancer Maternal Grandmother     colon cancer  . Heart disease Maternal Grandmother   . Heart disease Maternal Grandfather   . Cancer Paternal Grandmother     breast cancer    Social History   Social History  . Marital status: Married    Spouse name: [name]   . Number of children: 0  . Years of education: 12   Occupational History  . fabrication and sales    Social History Main Topics  . Smoking status: Never Smoker  . Smokeless tobacco: Never Used  . Alcohol use 0.0 - 1.2 oz/week  . Drug use: No  . Sexual activity: Yes    Partners: Male, Male   Other Topics Concern  . None   Social History Narrative   Patient lives at home with wife [name].   Patient is ambidextrous    I'm a moderately active vegetarian. But I like [place]. But I drink a lot of water.  1 cup of coffee, 6-7 cups of tea/day.       Review of Systems  Constitutional: Negative.   HENT: Negative.   Eyes: Negative.   Respiratory: Negative.   Cardiovascular: Negative.   Gastrointestinal: Negative.   Genitourinary: Negative.   Musculoskeletal: Positive for arthralgias, back pain and gait problem. Negative for joint swelling, myalgias, neck pain and neck stiffness.  Skin: Negative.   Psychiatric/Behavioral:       Memory loss and cognitive decline        Objective:  Physical Exam  Constitutional: He is oriented to person, place, and time. He appears well-developed and well-nourished. He is active and cooperative.  Non-toxic appearance. He does not have a sickly appearance. He does not appear ill. No distress.  BP  136/84 (BP Location: Right Arm, Patient Position: Sitting, Cuff Size: Large)   Pulse 80   Temp 98.1 F (36.7 C) (Oral)   Resp 16   Ht 5' 11.25" (1.81 m)   Wt 204 lb 2 oz (92.6 kg)   SpO2 98%   BMI 28.27 kg/m    HENT:  Head: Normocephalic and atraumatic.  Right Ear: Hearing, tympanic membrane, external ear and ear canal normal.  Left Ear: Hearing, tympanic membrane, external ear and ear canal normal.  Nose: Nose normal.  Mouth/Throat: Uvula is midline, oropharynx is clear and moist and mucous membranes are normal. He does not have dentures. No oral lesions. No trismus in the jaw. Normal dentition. No dental abscesses, uvula swelling, lacerations or dental caries.  Eyes: Conjunctivae, EOM and lids are normal. Pupils are equal, round, and reactive to light. Right eye exhibits no discharge. Left eye exhibits no discharge. No scleral icterus.  Fundoscopic exam:      The right eye shows no arteriolar narrowing, no AV nicking, no exudate, no hemorrhage and no papilledema.       The left eye shows no arteriolar narrowing, no AV nicking, no exudate, no hemorrhage and no papilledema.  Neck: Normal range of motion, full passive range of motion without pain and phonation normal. Neck supple. No spinous process tenderness and no muscular tenderness present. No neck rigidity. No tracheal deviation, no edema, no erythema and normal range of motion present. No thyromegaly present.  Cardiovascular: Normal rate, regular rhythm, S1 normal, S2 normal, normal heart sounds, intact distal pulses and normal pulses.  Exam reveals no gallop and no friction rub.   No murmur heard. Pulmonary/Chest: Effort normal and breath sounds normal. No respiratory distress. He has no wheezes. He has no rales.  Abdominal: Soft. Normal appearance and bowel sounds are normal. He exhibits no distension and no mass. There is no hepatosplenomegaly. There is no tenderness. There is no rebound and no guarding. No hernia. Hernia confirmed  negative in the right inguinal area and confirmed negative in the left inguinal area.  Genitourinary: Testes normal and penis normal. Circumcised.  Musculoskeletal: Normal range of motion. He exhibits no edema.       Cervical back: Normal. He exhibits normal range of motion, no tenderness, no bony tenderness, no swelling, no edema, no deformity, no laceration, no pain, no spasm and normal pulse.       Thoracic back: Normal.       Lumbar back: He exhibits tenderness. He exhibits normal range of motion, no bony tenderness and no spasm.  Somewhat slow to move about the exam room, lie of the table and then rise from supine position due to LBP. Wearing a hinged brace on the RIGHT  knee. Tells me "not to bother" examining his legs or performing LE reflexes, as "they are messed up, don't exist" and are being managed elsewhere.  Lymphadenopathy:       Head (right side): No submental, no submandibular, no tonsillar, no preauricular, no posterior auricular and no occipital adenopathy present.       Head (left side): No submental, no submandibular, no tonsillar, no preauricular, no posterior auricular and no occipital adenopathy present.    He has no cervical adenopathy.       Right: No inguinal and no supraclavicular adenopathy present.       Left: No inguinal and no supraclavicular adenopathy present.  Neurological: He is alert and oriented to person, place, and time. He has normal strength and normal reflexes. He displays no tremor. No cranial nerve deficit. He exhibits normal muscle tone. Coordination and gait normal.  Skin: Skin is warm, dry and intact. No abrasion, no ecchymosis, no laceration, no lesion and no rash noted. He is not diaphoretic. No cyanosis or erythema. No pallor. Nails show no clubbing.  Psychiatric: He has a normal mood and affect. His speech is normal and behavior is normal. Judgment and thought content normal. Cognition and memory are normal.           Assessment & Plan:  1.  Annual physical exam Age appropriate anticipatory guidance provided.  2. Screening for deficiency anemia - CBC with Differential/Platelet  3. Screening for hyperlipidemia - Lipid panel  4. Screening for thyroid disorder - TSH  5. Screening for metabolic disorder - Comprehensive metabolic panel  6. Screening for blood or protein in urine - POCT urinalysis dipstick - POCT Microscopic Urinalysis ([facility])  7. Screening for HIV (human immunodeficiency virus) - HIV antibody  8. Routine screening for STI (sexually transmitted infection) Recommend daily use of valacyclovir. - GC/Chlamydia Probe Amp - Hepatitis B surface antibody - Hepatitis B surface antigen - Hepatitis C antibody  9. Need for Tdap vaccination Unfortunately, our current supply of Tdap is exhausted and our new supply has not yet arrived. He will be contacted when it comes in so that he may return for the vaccine. - Tdap vaccine greater than or equal to 7yo IM; Future  10. Recurrent genital HSV (herpes simplex virus) infection He can check with his insurance plan to see if there is an alternate preferred suppressive agent on formulary. - valACYclovir (VALTREX) 500 MG tablet; Take 1 tablet (500 mg total) by mouth daily.  Dispense: 90 tablet; Refill: 3  11. Psoriasis Stable. - Triamcinolone Acetonide 0.025 % LOTN; Apply sparingly to affected area prn  Dispense: 60 mL; Refill: 9 - triamcinolone (KENALOG) 0.025 % cream; APPLY SPARINGLY TO AFFECTED AREAS AS DIRECTED.  Dispense: 80 g; Refill: 9  12. LBP radiating to right leg Continue specialty management.  13. BMI 28.0-28.9,adult Health eating and exercise.   Fara Chute, PA-C Physician Assistant-Certified [facility]

## 2016-02-08 NOTE — Telephone Encounter (Signed)
Sheketia let us know when they come in.

## 2016-02-09 LAB — CBC WITH DIFFERENTIAL/PLATELET
BASOS ABS: 0 10*3/uL (ref 0.0–0.2)
BASOS: 1 %
EOS (ABSOLUTE): 0.1 10*3/uL (ref 0.0–0.4)
Eos: 1 %
HEMATOCRIT: 47 % (ref 37.5–51.0)
HEMOGLOBIN: 15.7 g/dL (ref 12.6–17.7)
IMMATURE GRANULOCYTES: 0 %
Immature Grans (Abs): 0 10*3/uL (ref 0.0–0.1)
LYMPHS: 30 %
Lymphocytes Absolute: 1.8 10*3/uL (ref 0.7–3.1)
MCH: 28.2 pg (ref 26.6–33.0)
MCHC: 33.4 g/dL (ref 31.5–35.7)
MCV: 85 fL (ref 79–97)
MONOS ABS: 0.5 10*3/uL (ref 0.1–0.9)
Monocytes: 8 %
Neutrophils Absolute: 3.8 10*3/uL (ref 1.4–7.0)
Neutrophils: 60 %
Platelets: 366 10*3/uL (ref 150–379)
RBC: 5.56 x10E6/uL (ref 4.14–5.80)
RDW: 13.2 % (ref 12.3–15.4)
WBC: 6.2 10*3/uL (ref 3.4–10.8)

## 2016-02-09 LAB — COMPREHENSIVE METABOLIC PANEL
ALK PHOS: 106 IU/L (ref 39–117)
ALT: 14 IU/L (ref 0–44)
AST: 17 IU/L (ref 0–40)
Albumin/Globulin Ratio: 2.6 — ABNORMAL HIGH (ref 1.2–2.2)
Albumin: 5.4 g/dL (ref 3.5–5.5)
BUN/Creatinine Ratio: 11 (ref 9–20)
BUN: 10 mg/dL (ref 6–20)
Bilirubin Total: 0.3 mg/dL (ref 0.0–1.2)
CALCIUM: 10.4 mg/dL — AB (ref 8.7–10.2)
CO2: 25 mmol/L (ref 18–29)
CREATININE: 0.9 mg/dL (ref 0.76–1.27)
Chloride: 99 mmol/L (ref 96–106)
GFR calc Af Amer: 130 mL/min/{1.73_m2} (ref >59)
GFR, EST NON AFRICAN AMERICAN: 113 mL/min/{1.73_m2} (ref >59)
GLOBULIN, TOTAL: 2.1 g/dL (ref 1.5–4.5)
GLUCOSE: 94 mg/dL (ref 65–99)
Potassium: 4.8 mmol/L (ref 3.5–5.2)
SODIUM: 141 mmol/L (ref 134–144)
Total Protein: 7.5 g/dL (ref 6.0–8.5)

## 2016-02-09 LAB — LIPID PANEL
CHOL/HDL RATIO: 5.2 ratio — AB (ref 0.0–5.0)
Cholesterol, Total: 255 mg/dL — ABNORMAL HIGH (ref 100–199)
HDL: 49 mg/dL (ref >39)
LDL CALC: 162 mg/dL — AB (ref 0–99)
TRIGLYCERIDES: 221 mg/dL — AB (ref 0–149)
VLDL Cholesterol Cal: 44 mg/dL — ABNORMAL HIGH (ref 5–40)

## 2016-02-09 LAB — HEPATITIS C ANTIBODY

## 2016-02-09 LAB — HEPATITIS B SURFACE ANTIGEN: HEP B S AG: NEGATIVE

## 2016-02-09 LAB — TSH: TSH: 1.05 u[IU]/mL (ref 0.450–4.500)

## 2016-02-09 LAB — HIV ANTIBODY (ROUTINE TESTING W REFLEX): HIV Screen 4th Generation wRfx: NONREACTIVE

## 2016-02-09 LAB — HEPATITIS B SURFACE ANTIBODY, QUANTITATIVE

## 2016-02-10 LAB — GC/CHLAMYDIA PROBE AMP
Chlamydia trachomatis, NAA: NEGATIVE
NEISSERIA GONORRHOEAE BY PCR: NEGATIVE

## 2016-02-10 NOTE — Telephone Encounter (Signed)
Pt notified and will be in for TDAP

## 2016-02-11 ENCOUNTER — Encounter: Payer: Self-pay | Admitting: Physician Assistant

## 2016-02-11 DIAGNOSIS — Z789 Other specified health status: Secondary | ICD-10-CM | POA: Insufficient documentation

## 2016-02-12 ENCOUNTER — Ambulatory Visit (INDEPENDENT_AMBULATORY_CARE_PROVIDER_SITE_OTHER): Payer: No Typology Code available for payment source | Admitting: Physician Assistant

## 2016-02-12 DIAGNOSIS — Z23 Encounter for immunization: Secondary | ICD-10-CM

## 2016-02-22 NOTE — Progress Notes (Signed)
Presents for T-dap.

## 2016-05-13 ENCOUNTER — Ambulatory Visit (INDEPENDENT_AMBULATORY_CARE_PROVIDER_SITE_OTHER): Payer: No Typology Code available for payment source | Admitting: Physician Assistant

## 2016-05-13 VITALS — BP 118/80 | HR 84 | Temp 98.3°F | Resp 18 | Ht 71.25 in | Wt 208.8 lb

## 2016-05-13 DIAGNOSIS — R0981 Nasal congestion: Secondary | ICD-10-CM

## 2016-05-13 DIAGNOSIS — R0982 Postnasal drip: Secondary | ICD-10-CM

## 2016-05-13 LAB — POCT SEDIMENTATION RATE: POCT SED RATE: 5 mm/hr (ref 0–22)

## 2016-05-13 MED ORDER — AMOXICILLIN-POT CLAVULANATE 875-125 MG PO TABS: 1.0000 | ORAL_TABLET | Freq: Two times a day (BID) | ORAL | 0 refills | Status: DC

## 2016-05-13 NOTE — Progress Notes (Signed)
05/13/2016 12:54 PM   DOB: 11/01/1980 / MRN: [id number]  SUBJECTIVE:  Walter Evans is a 35 y.o. male presenting for a sinus "sinus infection" that started last night.  Complains of "lots of mucous" coming out of his nose. Has has tried Mucinex with pseudoephedrine 12 hour today and reports this is helping. He reports having some subjective fever, fatigue and cough.  No headache or significant sore throat.  Denies asthma and his is never smoker. 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 He relays a long history of sinus infections that have been relieved with augmentin, reports that he acutally had to see an ENT before he was properly diagnosed.  He also has a history of deviated septum.   He is allergic to shellfish allergy.   He  has a past medical history of Allergy; Arthritis; Back pain; HSV-2 (herpes simplex virus 2) infection; MRSA (methicillin resistant Staphylococcus aureus); and Psoriasis.    He  reports that he has never smoked. He has never used smokeless tobacco. He reports that he drinks alcohol. He reports that he does not use drugs. He  reports that he currently engages in sexual activity and has had both male and male partners. The patient  has a past surgical history that includes Tonsillectomy (2007) and Wisdom tooth extraction (2003).  His family history includes Cancer in his father, maternal grandmother, and paternal grandmother; Cancer (age of onset: 37) in his mother; Heart disease in his maternal grandfather, maternal grandmother, and mother; Hyperlipidemia in his father; Hypertension in his father and mother.  Review of Systems  Constitutional: Positive for chills and fever.  Respiratory: Positive for cough.   Cardiovascular: Negative for chest pain.  Gastrointestinal: Negative for abdominal pain, nausea and vomiting.  Skin: Negative for itching and rash.  Neurological: Negative for dizziness.    The problem list  and medications were reviewed and updated by myself where necessary and exist elsewhere in the encounter.   OBJECTIVE:  BP 118/80   Pulse 84   Temp 98.3 F (36.8 C) (Oral)   Resp 18   Ht 5' 11.25" (1.81 m)   Wt 208 lb 12.8 oz (94.7 kg)   SpO2 100%   BMI 28.92 kg/m   Physical Exam  Constitutional: He is oriented to person, place, and time. He appears well-developed and well-nourished. No distress.  Cardiovascular: Normal rate, regular rhythm and normal heart sounds.   Pulmonary/Chest: Effort normal and breath sounds normal.  Musculoskeletal: Normal range of motion.  Neurological: He is alert and oriented to person, place, and time. No cranial nerve deficit. Coordination normal.  Skin: He is not diaphoretic.    Results for orders placed or performed in visit on 05/13/16 (from the past 72 hour(s))  POCT SEDIMENTATION RATE     Status: None   Collection Time: 05/13/16 12:46 PM  Result Value Ref Range   POCT SED RATE 5 0 - 22 mm/hr    No results found.  ASSESSMENT AND PLAN  Kaulder was seen today for sinusitis.  Umut was seen today for sinusitis.  Diagnoses and all orders for this visit:  PND (post-nasal drip): He has features of a viral uri, however his HPI lends itself to chronic sinusitis.  Exam normal today.  Will run a sed rate and if elevated will have him start augmentin today.  If normal will have him hold abx for 10 days of illness total before filling.  Nasal congestion -     POCT SEDIMENTATION RATE  Other orders -     amoxicillin-clavulanate (AUGMENTIN) 875-125 MG tablet; Take 1 tablet by mouth 2 (two) times daily. Please allow for ten days of total illness before filling.       The patient is advised to call or return to clinic if he does not see an improvement in symptoms, or to seek the care of the closest emergency department if he worsens with the above plan.   Philis Fendt, MHS, PA-C [facility] 05/13/2016 12:54 PM

## 2016-05-13 NOTE — Patient Instructions (Signed)
     IF you received an x-ray today, you will receive an invoice from [facility]. Please contact [facility] at [phone] with questions or concerns regarding your invoice.   IF you received labwork today, you will receive an invoice from [facility]. Please contact [facility] at [phone] with questions or concerns regarding your invoice.   Our billing staff will not be able to assist you with questions regarding bills from these companies.  You will be contacted with the lab results as soon as they are available. The fastest way to get your results is to activate your My Chart account. Instructions are located on the last page of this paperwork. If you have not heard from us regarding the results in 2 weeks, please contact this office.      

## 2016-06-14 ENCOUNTER — Ambulatory Visit (INDEPENDENT_AMBULATORY_CARE_PROVIDER_SITE_OTHER): Payer: No Typology Code available for payment source

## 2016-06-14 ENCOUNTER — Encounter: Payer: Self-pay | Admitting: Physician Assistant

## 2016-06-14 ENCOUNTER — Ambulatory Visit (INDEPENDENT_AMBULATORY_CARE_PROVIDER_SITE_OTHER): Payer: No Typology Code available for payment source | Admitting: Physician Assistant

## 2016-06-14 VITALS — BP 140/82 | HR 93 | Temp 98.4°F | Resp 17 | Ht 71.0 in | Wt 208.8 lb

## 2016-06-14 DIAGNOSIS — J329 Chronic sinusitis, unspecified: Secondary | ICD-10-CM

## 2016-06-14 DIAGNOSIS — J3489 Other specified disorders of nose and nasal sinuses: Secondary | ICD-10-CM | POA: Diagnosis not present

## 2016-06-14 DIAGNOSIS — J029 Acute pharyngitis, unspecified: Secondary | ICD-10-CM | POA: Diagnosis not present

## 2016-06-14 DIAGNOSIS — R0981 Nasal congestion: Secondary | ICD-10-CM

## 2016-06-14 LAB — POCT SEDIMENTATION RATE: POCT SED RATE: 6 mm/hr (ref 0–22)

## 2016-06-14 MED ORDER — METHYLPREDNISOLONE 4 MG PO TABS: 4.0000 mg | ORAL_TABLET | Freq: Every day | ORAL | 0 refills | Status: DC

## 2016-06-14 MED ORDER — CLARITHROMYCIN 250 MG PO TABS: 250.0000 mg | ORAL_TABLET | Freq: Two times a day (BID) | ORAL | 0 refills | Status: AC

## 2016-06-14 NOTE — Patient Instructions (Addendum)
Please take steroid taper now. If you are not feeling better after that, start the antibiotics. If you have recurrent infections we will refer you to ENT.   Thank you for coming in today. I hope you feel we met your needs.  Feel free to call [facility] if you have any questions or further requests.  Please consider signing up for MyChart if you do not already have it, as this is a great way to communicate with me.  Best,  Whitney McVey, PA-C    Sinusitis, Adult Sinusitis is soreness and inflammation of your sinuses. Sinuses are hollow spaces in the bones around your face. Your sinuses are located:  Around your eyes.  In the middle of your forehead.  Behind your nose.  In your cheekbones. Your sinuses and nasal passages are lined with a stringy fluid (mucus). Mucus normally drains out of your sinuses. When your nasal tissues become inflamed or swollen, the mucus can become trapped or blocked so air cannot flow through your sinuses. This allows bacteria, viruses, and funguses to grow, which leads to infection. Sinusitis can develop quickly and last for 7?10 days (acute) or for more than 12 weeks (chronic). Sinusitis often develops after a cold. What are the causes? This condition is caused by anything that creates swelling in the sinuses or stops mucus from draining, including:  Allergies.  Asthma.  Bacterial or viral infection.  Abnormally shaped bones between the nasal passages.  Nasal growths that contain mucus (nasal polyps).  Narrow sinus openings.  Pollutants, such as chemicals or irritants in the air.  A foreign object stuck in the nose.  A fungal infection. This is rare. What increases the risk? The following factors may make you more likely to develop this condition:  Having allergies or asthma.  Having had a recent cold or respiratory tract infection.  Having structural deformities or blockages in your nose or sinuses.  Having a weak immune system.  Doing a lot  of swimming or diving.  Overusing nasal sprays.  Smoking. What are the signs or symptoms? The main symptoms of this condition are pain and a feeling of pressure around the affected sinuses. Other symptoms include:  Upper toothache.  Earache.  Headache.  Bad breath.  Decreased sense of smell and taste.  A cough that may get worse at night.  Fatigue.  Fever.  Thick drainage from your nose. The drainage is often green and it may contain pus (purulent).  Stuffy nose or congestion.  Postnasal drip. This is when extra mucus collects in the throat or back of the nose.  Swelling and warmth over the affected sinuses.  Sore throat.  Sensitivity to light. How is this diagnosed? This condition is diagnosed based on symptoms, a medical history, and a physical exam. To find out if your condition is acute or chronic, your health care provider may:  Look in your nose for signs of nasal polyps.  Tap over the affected sinus to check for signs of infection.  View the inside of your sinuses using an imaging device that has a light attached (endoscope). If your health care provider suspects that you have chronic sinusitis, you may also:  Be tested for allergies.  Have a sample of mucus taken from your nose (nasal culture) and checked for bacteria.  Have a mucus sample examined to see if your sinusitis is related to an allergy. If your sinusitis does not respond to treatment and it lasts longer than 8 weeks, you may have an  MRI or CT scan to check your sinuses. These scans also help to determine how severe your infection is. In rare cases, a bone biopsy may be done to rule out more serious types of fungal sinus disease. How is this treated? Treatment for sinusitis depends on the cause and whether your condition is chronic or acute. If a virus is causing your sinusitis, your symptoms will go away on their own within 10 days. You may be given medicines to relieve your symptoms,  including:  Topical nasal decongestants. They shrink swollen nasal passages and let mucus drain from your sinuses.  Antihistamines. These drugs block inflammation that is triggered by allergies. This can help to ease swelling in your nose and sinuses.  Topical nasal corticosteroids. These are nasal sprays that ease inflammation and swelling in your nose and sinuses.  Nasal saline washes. These rinses can help to get rid of thick mucus in your nose. If your condition is caused by bacteria, you will be given an antibiotic medicine. If your condition is caused by a fungus, you will be given an antifungal medicine. Surgery may be needed to correct underlying conditions, such as narrow nasal passages. Surgery may also be needed to remove polyps. Follow these instructions at home: Medicines  Take, use, or apply over-the-counter and prescription medicines only as told by your health care provider. These may include nasal sprays.  If you were prescribed an antibiotic medicine, take it as told by your health care provider. Do not stop taking the antibiotic even if you start to feel better. Hydrate and Humidify  Drink enough water to keep your urine clear or pale yellow. Staying hydrated will help to thin your mucus.  Use a cool mist humidifier to keep the humidity level in your home above 50%.  Inhale steam for 10-15 minutes, 3-4 times a day or as told by your health care provider. You can do this in the bathroom while a hot shower is running.  Limit your exposure to cool or dry air. Rest  Rest as much as possible.  Sleep with your head raised (elevated).  Make sure to get enough sleep each night. General instructions  Apply a warm, moist washcloth to your face 3-4 times a day or as told by your health care provider. This will help with discomfort.  Wash your hands often with soap and water to reduce your exposure to viruses and other germs. If soap and water are not available, use hand  sanitizer.  Do not smoke. Avoid being around people who are smoking (secondhand smoke).  Keep all follow-up visits as told by your health care provider. This is important. Contact a health care provider if:  You have a fever.  Your symptoms get worse.  Your symptoms do not improve within 10 days. Get help right away if:  You have a severe headache.  You have persistent vomiting.  You have pain or swelling around your face or eyes.  You have vision problems.  You develop confusion.  Your neck is stiff.  You have trouble breathing. This information is not intended to replace advice given to you by your health care provider. Make sure you discuss any questions you have with your health care provider. Document Released: 06/27/2005 Document Revised: 02/21/2016 Document Reviewed: 04/22/2015 Elsevier Interactive Patient Education  2017 [employer].   IF you received an x-ray today, you will receive an invoice from [facility]. Please contact [facility] at [phone] with questions or concerns regarding your invoice.  IF you received labwork today, you will receive an invoice from [employer]. Please contact [facility] at [phone] with questions or concerns regarding your invoice.   Our billing staff will not be able to assist you with questions regarding bills from these companies.  You will be contacted with the lab results as soon as they are available. The fastest way to get your results is to activate your My Chart account. Instructions are located on the last page of this paperwork. If you have not heard from [place] regarding the results in 2 weeks, please contact this office.

## 2016-06-14 NOTE — Progress Notes (Signed)
Walter Evans  MRN: [id number] DOB: December 21, 1980  PCP: No PCP Per Patient  Subjective:  Pt is 35 year old male who presents to clinic for sinus issues. He was here one month ago for the same. Treated with 10 days of Augmentin. States he felt better completely, however his symptoms came back about five days later. Reports sinus pressure and nasal drainage. His post nasal drip at night causes him to cough up large amounts of mucus in the moring.  Notes fever and occasional chills, headache. History of sinus infection about ten years ago. States he was misdiagnosed for several years and he turned out to have a chronic sinus infection, treated with Augmenting x 4 weeks. He has a deviated septum.  Taking Mucinex and pseudoephedrine, which is helping some. No history of seasonal allergies.   Review of Systems  Constitutional: Negative for chills and diaphoresis.  HENT: Positive for congestion, postnasal drip, rhinorrhea, sinus pain, sinus pressure and sore throat.   Respiratory: Negative for cough, chest tightness, shortness of breath and wheezing.   Cardiovascular: Negative for chest pain, palpitations and leg swelling.  Gastrointestinal: Negative for diarrhea, nausea and vomiting.  Musculoskeletal: Negative for neck pain.  Neurological: Negative for dizziness, syncope, light-headedness and headaches.  Psychiatric/Behavioral: Negative for sleep disturbance. The patient is not nervous/anxious.     Patient Active Problem List   Diagnosis Date Noted  . Immune to hepatitis B 02/11/2016  . LBP radiating to right leg 02/06/2016  . BMI 28.0-28.9,adult 02/06/2016  . Memory loss 05/08/2014  . Cognitive decline 01/01/2014  . Psoriasis 11/29/2013  . Sinusitis, chronic 01/03/2012  . 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  triamcinolone (KENALOG) 0.025 % cream APPLY SPARINGLY TO AFFECTED AREAS AS DIRECTED. 80 g 9  . Triamcinolone Acetonide 0.025 % LOTN Apply sparingly to affected area prn 60 mL 9  . valACYclovir (VALTREX) 500 MG tablet Take 1 tablet (500 mg total) by mouth daily. 90 tablet 3   No current facility-administered medications on file prior to visit.     Allergies  Allergen Reactions  . Shellfish Allergy     unknown     Objective:  BP 140/82 (BP Location: Right Arm, Patient Position: Sitting, Cuff Size: Large)   Pulse 93   Temp 98.4 F (36.9 C) (Oral)   Resp 17   Ht 5\' 11"  (1.803 m)   Wt 208 lb 12.8 oz (94.7 kg)   SpO2 98%   BMI 29.12 kg/m   Physical Exam  Constitutional: He is oriented to person, place, and time and well-developed, well-nourished, and in no distress. No distress.  HENT:  Right Ear: Tympanic membrane normal.  Left Ear: Tympanic membrane normal.  Nose: Right sinus exhibits maxillary sinus tenderness. Right sinus exhibits no frontal sinus tenderness. Left sinus exhibits maxillary sinus tenderness. Left sinus exhibits no frontal sinus tenderness.  Mouth/Throat: Posterior oropharyngeal edema present. No oropharyngeal exudate or posterior oropharyngeal erythema.  Eyes: Conjunctivae are normal.  Cardiovascular: Normal rate, regular rhythm and normal heart sounds.   Pulmonary/Chest: Effort normal and breath sounds normal.  Neurological: He is alert and oriented to person, place, and time. GCS score is 15.  Skin: Skin is warm and dry.  Psychiatric: Mood, memory, affect and judgment normal.  Vitals reviewed.   Dg Sinuses Complete  Result Date: 06/14/2016 CLINICAL  DATA:  Recurrent sinusitis x1 month EXAM: PARANASAL SINUSES - COMPLETE 3 + VIEW COMPARISON:  None. FINDINGS: The paranasal sinus are aerated. There is no evidence of sinus opacification air-fluid levels or significant mucosal thickening. No significant bone abnormalities are seen. IMPRESSION: No significant nor  acute sinus mucosal abnormality. Electronically Signed   By: Ashley Royalty M.D.   On: 06/14/2016 14:37    Assessment and Plan :  1. Sinusitis, unspecified chronicity, unspecified location 2. Sinus pressure 3. Nasal congestion 4. Sore throat - DG Sinuses Complete; Future - POCT SEDIMENTATION RATE - methylPREDNISolone (MEDROL) 4 MG tablet; Take 1 tablet (4 mg total) by mouth daily. 24 mg on day 1, then decrease by 4 mg/day x five days  Dispense: 21 tablet; Refill: 0 - clarithromycin (BIAXIN) 250 MG tablet; Take 1 tablet (250 mg total) by mouth 2 (two) times daily.  Dispense: 42 tablet; Refill: 0 - Advised patient to take course of steroid taper. If he is still not feeling better after this course, OK to fill antibiotic and take entire course. RTC if no improvement/symptoms return. Consider ENT referral if recurrent infections.   Mercer Pod, PA-C  [facility] 06/14/2016 2:09 PM

## 2016-08-02 ENCOUNTER — Ambulatory Visit (INDEPENDENT_AMBULATORY_CARE_PROVIDER_SITE_OTHER): Payer: No Typology Code available for payment source | Admitting: Family Medicine

## 2016-08-02 VITALS — BP 108/70 | HR 79 | Temp 97.4°F | Resp 16 | Ht 71.0 in | Wt 203.0 lb

## 2016-08-02 DIAGNOSIS — Z7185 Encounter for immunization safety counseling: Secondary | ICD-10-CM

## 2016-08-02 DIAGNOSIS — Z7189 Other specified counseling: Secondary | ICD-10-CM

## 2016-08-02 DIAGNOSIS — Z23 Encounter for immunization: Secondary | ICD-10-CM

## 2016-08-02 NOTE — Patient Instructions (Addendum)
Return for 2nd hepatitis A immunization.  IF you received an x-ray today, you will receive an invoice from [facility]. Please contact [facility] at [phone] with questions or concerns regarding your invoice.   IF you received labwork today, you will receive an invoice from [facility]. Please contact [facility] at [phone] with questions or concerns regarding your invoice.   Our billing staff will not be able to assist you with questions regarding bills from these companies.  You will be contacted with the lab results as soon as they are available. The fastest way to get your results is to activate your My Chart account. Instructions are located on the last page of this paperwork. If you have not heard from [place] regarding the results in 2 weeks, please contact this office.

## 2016-08-02 NOTE — Progress Notes (Signed)
Encounter for immunizations only.  36 year old male presents today for hepatitis A and meningococcal vaccine. Pt reports that he is required to have these vaccinations as he works in the "sex [employer], [employer]. Already had a Hepatitis B series. Denies any previous reactions to vaccinations.    Vitals:   08/02/16 1559  BP: 108/70  Pulse: 79  Resp: 16  Temp: 97.4 F (36.3 C)     Immunization History  Administered Date(s) Administered  . Tdap 02/12/2016    Cardiovascular-Heart rate and sound -NL  Lungs- Regular Effort, Absent of Distress   1. Immunization counseling and administration of vaccinations. Return for 2nd vaccination of hepatitis A   Walter Evans. Kenton Kingfisher, MSN, FNP-C [facility] [place]

## 2016-10-26 ENCOUNTER — Telehealth: Payer: Self-pay | Admitting: General Practice

## 2017-03-16 ENCOUNTER — Ambulatory Visit (INDEPENDENT_AMBULATORY_CARE_PROVIDER_SITE_OTHER): Payer: No Typology Code available for payment source | Admitting: Family Medicine

## 2017-03-16 DIAGNOSIS — Z23 Encounter for immunization: Secondary | ICD-10-CM | POA: Diagnosis not present

## 2017-03-22 NOTE — Progress Notes (Signed)
Pt not seen by provider.

## 2017-07-01 ENCOUNTER — Other Ambulatory Visit: Payer: Self-pay | Admitting: Physician Assistant

## 2017-07-01 DIAGNOSIS — A6000 Herpesviral infection of urogenital system, unspecified: Secondary | ICD-10-CM

## 2017-07-05 ENCOUNTER — Other Ambulatory Visit: Payer: Self-pay

## 2017-07-05 ENCOUNTER — Ambulatory Visit: Payer: No Typology Code available for payment source | Admitting: Physician Assistant

## 2017-07-05 ENCOUNTER — Encounter: Payer: Self-pay | Admitting: Physician Assistant

## 2017-07-05 VITALS — BP 126/76 | HR 104 | Temp 98.3°F | Resp 17 | Ht 71.0 in | Wt 208.2 lb

## 2017-07-05 DIAGNOSIS — L409 Psoriasis, unspecified: Secondary | ICD-10-CM | POA: Diagnosis not present

## 2017-07-05 DIAGNOSIS — A6000 Herpesviral infection of urogenital system, unspecified: Secondary | ICD-10-CM | POA: Diagnosis not present

## 2017-07-05 MED ORDER — TRIAMCINOLONE ACETONIDE 0.025 % EX CREA
TOPICAL_CREAM | CUTANEOUS | 2 refills | Status: DC
Start: 2017-07-05 — End: 2018-04-11

## 2017-07-05 MED ORDER — VALACYCLOVIR HCL 500 MG PO TABS: 500.0000 mg | ORAL_TABLET | Freq: Every day | ORAL | 3 refills | Status: DC

## 2017-07-05 MED ORDER — TRIAMCINOLONE ACETONIDE 0.025 % EX LOTN: TOPICAL_LOTION | CUTANEOUS | 2 refills | Status: DC
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
## 2017-07-05 NOTE — Assessment & Plan Note (Addendum)
Controlled. Continue daily suppressive therapy.

## 2017-07-05 NOTE — Assessment & Plan Note (Signed)
Continue current episodic treatment as needed.

## 2017-08-02 ENCOUNTER — Other Ambulatory Visit: Payer: Self-pay

## 2017-08-02 ENCOUNTER — Ambulatory Visit: Payer: No Typology Code available for payment source | Admitting: Family Medicine

## 2017-08-02 ENCOUNTER — Encounter: Payer: Self-pay | Admitting: Family Medicine

## 2017-08-02 VITALS — BP 118/72 | HR 70 | Temp 98.0°F | Resp 18 | Ht 72.95 in | Wt 206.4 lb

## 2017-08-02 DIAGNOSIS — J310 Chronic rhinitis: Secondary | ICD-10-CM

## 2017-08-02 DIAGNOSIS — R05 Cough: Secondary | ICD-10-CM | POA: Diagnosis not present

## 2017-08-02 DIAGNOSIS — J329 Chronic sinusitis, unspecified: Secondary | ICD-10-CM | POA: Diagnosis not present

## 2017-08-02 DIAGNOSIS — R059 Cough, unspecified: Secondary | ICD-10-CM

## 2017-08-02 MED ORDER — PREDNISONE 20 MG PO TABS: ORAL_TABLET | ORAL | 0 refills | Status: DC

## 2017-08-02 MED ORDER — AMOXICILLIN-POT CLAVULANATE 875-125 MG PO TABS: 1.0000 | ORAL_TABLET | Freq: Two times a day (BID) | ORAL | 0 refills | Status: DC

## 2017-08-02 NOTE — Patient Instructions (Addendum)
Drink plenty of fluids which helps keep the secretions thinner.  Continue using your over-the-counter cough medications or decongestants as needed.  Take the Augmentin 875 1 twice daily for infection  Take prednisone if you are not improved over the next few days, 20 mg 3 times daily for 2 days, then 2 daily for 2 days, then 1 daily for 2 days after breakfast with food  Return or get rechecked if worse    IF you received an x-ray today, you will receive an invoice from [facility]. Please contact [facility] at [phone] with questions or concerns regarding your invoice.   IF you received labwork today, you will receive an invoice from [place]. Please contact [facility] at [phone] with questions or concerns regarding your invoice.   Our billing staff will not be able to assist you with questions regarding bills from these companies.  You will be contacted with the lab results as soon as they are available. The fastest way to get your results is to activate your My Chart account. Instructions are located on the last page of this paperwork. If you have not heard from [place] regarding the results in 2 weeks, please contact this office.

## 2017-08-02 NOTE — Progress Notes (Signed)
Patient ID: Walter Evans, male    DOB: 1980-07-17  Age: 37 y.o. MRN: [id number]  Chief Complaint  Patient presents with  . Sinus Problem    X 1 1/2 weeks  . Facial Pain    Subjective:   37 year old man with a history of annual sinus infections over the last half dozen years, first occurring before that when it took a couple of years for him to be properly diagnosed and treated.  He began getting ill about a week and a half ago, and symptoms have continued to persist.  He has a pressure sensation in his sinuses, postnasal drainage, and a cough.  It is not terribly ill.  He does not smoke.  He does work out at [employer].  His wife has not been ill.  He works in and out of a Chiropractor.  Current allergies, medications, problem list, past/family and social histories reviewed.  Objective:  BP 118/72 (BP Location: Left Arm, Patient Position: Sitting, Cuff Size: Normal)   Pulse 70   Temp 98 F (36.7 C) (Oral)   Resp 18   Ht 6' 0.95" (1.853 m)   Wt 206 lb 6.4 oz (93.6 kg)   SpO2 97%   BMI 27.27 kg/m   Alert and oriented, no major acute distress.  TMs normal.  Throat clear.  He has low points pressure sensation to percussion on his sinuses.  Neck supple without significant nodes.  Chest is clear to auscultation without rhonchi, rales, wheezes.  Mild prolonged expiratory phase.  Heart regular without murmur.  He has an intermittent hacking cough.  Assessment & Plan:   Assessment: 1. Rhinosinusitis   2. Cough       Plan: See instructions  No orders of the defined types were placed in this encounter.   Meds ordered this encounter  Medications  . amoxicillin-clavulanate (AUGMENTIN) 875-125 MG tablet    Sig: Take 1 tablet by mouth 2 (two) times daily.    Dispense:  20 tablet    Refill:  0  . predniSONE (DELTASONE) 20 MG tablet    Sig: Take 3 daily for 2 days, then 2 for 2 days, then 1 for 2 days for sinusitis    Dispense:  12 tablet    Refill:  0         Patient  Instructions   Drink plenty of fluids which helps keep the secretions thinner.  Continue using your over-the-counter cough medications or decongestants as needed.  Take the Augmentin 875 1 twice daily for infection  Take prednisone if you are not improved over the next few days, 20 mg 3 times daily for 2 days, then 2 daily for 2 days, then 1 daily for 2 days after breakfast with food  Return or get rechecked if worse    IF you received an x-ray today, you will receive an invoice from [facility]. Please contact [facility] at [phone] with questions or concerns regarding your invoice.   IF you received labwork today, you will receive an invoice from [place]. Please contact [facility] at [phone] with questions or concerns regarding your invoice.   Our billing staff will not be able to assist you with questions regarding bills from these companies.  You will be contacted with the lab results as soon as they are available. The fastest way to get your results is to activate your My Chart account. Instructions are located on the last page of this paperwork. If you have not heard from [place]  regarding the results in 2 weeks, please contact this office.        No Follow-up on file.   Tonica Brasington, MD 08/02/2017

## 2018-01-16 ENCOUNTER — Ambulatory Visit: Payer: No Typology Code available for payment source | Admitting: Family Medicine

## 2018-01-16 ENCOUNTER — Encounter: Payer: Self-pay | Admitting: Family Medicine

## 2018-01-16 ENCOUNTER — Other Ambulatory Visit: Payer: Self-pay

## 2018-01-16 VITALS — BP 118/72 | HR 93 | Temp 99.0°F | Resp 16 | Ht 74.02 in | Wt 190.0 lb

## 2018-01-16 DIAGNOSIS — J329 Chronic sinusitis, unspecified: Secondary | ICD-10-CM | POA: Diagnosis not present

## 2018-01-16 MED ORDER — AMOXICILLIN-POT CLAVULANATE 875-125 MG PO TABS: 1.0000 | ORAL_TABLET | Freq: Two times a day (BID) | ORAL | 0 refills | Status: DC

## 2018-01-16 NOTE — Progress Notes (Signed)
Chief Complaint  Patient presents with  . Sinus Problem    pt states he has been having pressure and headache x 1 week.   . Nasal Congestion    HPI    Pt reports sinus pressure  Little bit of a fever yesterday Nasal congestion Got worse after the rain Cough is productive of scant mucus He is taking mucinex and sudafed which helps the pressure The headaches are over the eye brows He denies nausea, vomiting, diarrhea, skin rash Does not smoke Has a cat at home   Symptoms started 1-2 weeks ago  Past Medical History:  Diagnosis Date  . Allergy   . Arthritis    RIGHT knee and back  . Back pain    L3-L4  . HSV-2 (herpes simplex virus 2) infection   . MRSA (methicillin resistant Staphylococcus aureus)   . Psoriasis     Current Outpatient Medications  Medication Sig Dispense Refill  . triamcinolone (KENALOG) 0.025 % cream APPLY SPARINGLY TO AFFECTED AREAS AS DIRECTED. 80 g 2  . Triamcinolone Acetonide 0.025 % LOTN Apply sparingly to affected area prn 60 mL 2  . valACYclovir (VALTREX) 500 MG tablet Take 1 tablet (500 mg total) by mouth daily. 90 tablet 3   No current facility-administered medications for this visit.     Allergies:  Allergies  Allergen Reactions  . Shellfish Allergy     unknown    Past Surgical History:  Procedure Laterality Date  . TONSILLECTOMY  2007  . WISDOM TOOTH EXTRACTION  2003    Social History   Socioeconomic History  . Marital status: Married    Spouse name: [name]   . Number of children: 0  . Years of education: 25  . Highest education level: Not on file  Occupational History  . Occupation: Production manager  Social Needs  . Financial resource strain: Not on file  . Food insecurity:    Worry: Not on file    Inability: Not on file  . Transportation needs:    Medical: Not on file    Non-medical: Not on file  Tobacco Use  . Smoking status: Never Smoker  . Smokeless tobacco: Never Used  Substance and Sexual Activity  .  Alcohol use: Yes    Alcohol/week: 0.0 - 1.2 oz  . Drug use: No  . Sexual activity: Yes    Partners: Male, Male  Lifestyle  . Physical activity:    Days per week: Not on file    Minutes per session: Not on file  . Stress: Not on file  Relationships  . Social connections:    Talks on phone: Not on file    Gets together: Not on file    Attends religious service: Not on file    Active member of club or organization: Not on file    Attends meetings of clubs or organizations: Not on file    Relationship status: Not on file  Other Topics Concern  . Not on file  Social History Narrative   Patient lives at home with wife [name].   Patient is ambidextrous    I'm a moderately active vegetarian. But I like [place]. But I drink a lot of water.   1 cup of coffee, 6-7 cups of tea/day.    Family History  Problem Relation Age of Onset  . Hypertension Mother   . Cancer Mother 25       colon cancer  . Heart disease Mother   . Hypertension Father   .  Cancer Father        "a benign form of cancer that causes tumors in the ear."  . Hyperlipidemia Father   . Cancer Maternal Grandmother        colon cancer  . Heart disease Maternal Grandmother   . Heart disease Maternal Grandfather   . Cancer Paternal Grandmother        breast cancer     ROS Review of Systems See HPI Constitution: + fevers or chills No malaise No diaphoresis Skin: No rash or itching Eyes: no blurry vision, no double vision GU: no dysuria or hematuria Neuro: no dizziness or headaches all others reviewed and negative   Objective: Vitals:   01/16/18 1005  BP: 118/72  Pulse: 93  Resp: 16  Temp: 99 F (37.2 C)  TempSrc: Oral  SpO2: 100%  Weight: 190 lb (86.2 kg)  Height: 6' 2.02" (1.88 m)    Physical Exam General: alert, oriented, in NAD Head: normocephalic, atraumatic, no sinus tenderness Eyes: EOM intact, no scleral icterus or conjunctival injection Ears: TM clear bilaterally Nose: mucosa  nonerythematous, nonedematous Throat: no pharyngeal exudate or erythema Lymph: no posterior auricular, submental or cervical lymph adenopathy Heart: normal rate, normal sinus rhythm, no murmurs Lungs: clear to auscultation bilaterally, no wheezing   Assessment and Plan Vi was seen today for sinus problem and nasal congestion.  Diagnoses and all orders for this visit:  Rhinosinusitis   Discussed augmentin if there is continued worsening symptoms For now continue mucinex with sudafed Hydration and rest  Lloyd

## 2018-01-16 NOTE — Patient Instructions (Addendum)
Take Augmentin for sinusitis if there is continued worsening symptoms Also add probiotic    IF you received an x-ray today, you will receive an invoice from [facility]. Please contact [facility] at [phone] with questions or concerns regarding your invoice.   IF you received labwork today, you will receive an invoice from [place]. Please contact [facility] at [phone] with questions or concerns regarding your invoice.   Our billing staff will not be able to assist you with questions regarding bills from these companies.  You will be contacted with the lab results as soon as they are available. The fastest way to get your results is to activate your My Chart account. Instructions are located on the last page of this paperwork. If you have not heard from [place] regarding the results in 2 weeks, please contact this office.      Sinusitis, Adult Sinusitis is soreness and inflammation of your sinuses. Sinuses are hollow spaces in the bones around your face. Your sinuses are located:  Around your eyes.  In the middle of your forehead.  Behind your nose.  In your cheekbones.  Your sinuses and nasal passages are lined with a stringy fluid (mucus). Mucus normally drains out of your sinuses. When your nasal tissues become inflamed or swollen, the mucus can become trapped or blocked so air cannot flow through your sinuses. This allows bacteria, viruses, and funguses to grow, which leads to infection. Sinusitis can develop quickly and last for 7?10 days (acute) or for more than 12 weeks (chronic). Sinusitis often develops after a cold. What are the causes? This condition is caused by anything that creates swelling in the sinuses or stops mucus from draining, including:  Allergies.  Asthma.  Bacterial or viral infection.  Abnormally shaped bones between the nasal passages.  Nasal growths that contain mucus (nasal polyps).  Narrow sinus openings.  Pollutants, such  as chemicals or irritants in the air.  A foreign object stuck in the nose.  A fungal infection. This is rare.  What increases the risk? The following factors may make you more likely to develop this condition:  Having allergies or asthma.  Having had a recent cold or respiratory tract infection.  Having structural deformities or blockages in your nose or sinuses.  Having a weak immune system.  Doing a lot of swimming or diving.  Overusing nasal sprays.  Smoking.  What are the signs or symptoms? The main symptoms of this condition are pain and a feeling of pressure around the affected sinuses. Other symptoms include:  Upper toothache.  Earache.  Headache.  Bad breath.  Decreased sense of smell and taste.  A cough that may get worse at night.  Fatigue.  Fever.  Thick drainage from your nose. The drainage is often green and it may contain pus (purulent).  Stuffy nose or congestion.  Postnasal drip. This is when extra mucus collects in the throat or back of the nose.  Swelling and warmth over the affected sinuses.  Sore throat.  Sensitivity to light.  How is this diagnosed? This condition is diagnosed based on symptoms, a medical history, and a physical exam. To find out if your condition is acute or chronic, your health care provider may:  Look in your nose for signs of nasal polyps.  Tap over the affected sinus to check for signs of infection.  View the inside of your sinuses using an imaging device that has a light attached (endoscope).  If your health care provider suspects  that you have chronic sinusitis, you may also:  Be tested for allergies.  Have a sample of mucus taken from your nose (nasal culture) and checked for bacteria.  Have a mucus sample examined to see if your sinusitis is related to an allergy.  If your sinusitis does not respond to treatment and it lasts longer than 8 weeks, you may have an MRI or CT scan to check your sinuses.  These scans also help to determine how severe your infection is. In rare cases, a bone biopsy may be done to rule out more serious types of fungal sinus disease. How is this treated? Treatment for sinusitis depends on the cause and whether your condition is chronic or acute. If a virus is causing your sinusitis, your symptoms will go away on their own within 10 days. You may be given medicines to relieve your symptoms, including:  Topical nasal decongestants. They shrink swollen nasal passages and let mucus drain from your sinuses.  Antihistamines. These drugs block inflammation that is triggered by allergies. This can help to ease swelling in your nose and sinuses.  Topical nasal corticosteroids. These are nasal sprays that ease inflammation and swelling in your nose and sinuses.  Nasal saline washes. These rinses can help to get rid of thick mucus in your nose.  If your condition is caused by bacteria, you will be given an antibiotic medicine. If your condition is caused by a fungus, you will be given an antifungal medicine. Surgery may be needed to correct underlying conditions, such as narrow nasal passages. Surgery may also be needed to remove polyps. Follow these instructions at home: Medicines  Take, use, or apply over-the-counter and prescription medicines only as told by your health care provider. These may include nasal sprays.  If you were prescribed an antibiotic medicine, take it as told by your health care provider. Do not stop taking the antibiotic even if you start to feel better. Hydrate and Humidify  Drink enough water to keep your urine clear or pale yellow. Staying hydrated will help to thin your mucus.  Use a cool mist humidifier to keep the humidity level in your home above 50%.  Inhale steam for 10-15 minutes, 3-4 times a day or as told by your health care provider. You can do this in the bathroom while a hot shower is running.  Limit your exposure to cool or dry  air. Rest  Rest as much as possible.  Sleep with your head raised (elevated).  Make sure to get enough sleep each night. General instructions  Apply a warm, moist washcloth to your face 3-4 times a day or as told by your health care provider. This will help with discomfort.  Wash your hands often with soap and water to reduce your exposure to viruses and other germs. If soap and water are not available, use hand sanitizer.  Do not smoke. Avoid being around people who are smoking (secondhand smoke).  Keep all follow-up visits as told by your health care provider. This is important. Contact a health care provider if:  You have a fever.  Your symptoms get worse.  Your symptoms do not improve within 10 days. Get help right away if:  You have a severe headache.  You have persistent vomiting.  You have pain or swelling around your face or eyes.  You have vision problems.  You develop confusion.  Your neck is stiff.  You have trouble breathing. This information is not intended to replace advice given  to you by your health care provider. Make sure you discuss any questions you have with your health care provider. Document Released: 06/27/2005 Document Revised: 02/21/2016 Document Reviewed: 04/22/2015 Elsevier Interactive Patient Education  [employer].

## 2018-04-11 ENCOUNTER — Ambulatory Visit: Payer: No Typology Code available for payment source | Admitting: Family Medicine

## 2018-04-11 ENCOUNTER — Other Ambulatory Visit: Payer: Self-pay

## 2018-04-11 ENCOUNTER — Encounter: Payer: Self-pay | Admitting: Family Medicine

## 2018-04-11 VITALS — BP 126/90 | HR 90 | Temp 98.7°F | Resp 16 | Ht 74.0 in | Wt 211.0 lb

## 2018-04-11 DIAGNOSIS — J329 Chronic sinusitis, unspecified: Secondary | ICD-10-CM

## 2018-04-11 DIAGNOSIS — R519 Headache, unspecified: Secondary | ICD-10-CM

## 2018-04-11 DIAGNOSIS — R51 Headache: Secondary | ICD-10-CM

## 2018-04-11 MED ORDER — FLUTICASONE PROPIONATE 50 MCG/ACT NA SUSP: NASAL | 1 refills | Status: DC

## 2018-04-11 MED ORDER — AMOXICILLIN-POT CLAVULANATE 875-125 MG PO TABS: ORAL_TABLET | ORAL | 0 refills | Status: DC

## 2018-04-11 NOTE — Patient Instructions (Addendum)
   Drink plenty of fluids and get enough rest  Take acetaminophen (Tylenol) 500 mg 2 pills 3 times daily as needed for headache  Use the fluticasone nose spray 2 sprays in each nostril twice daily for about 4 days, then reduce to daily use  Take the Augmentin (amoxicillin/clavulanate) 1 twice daily at breakfast and supper for antibiotic  If you continue to have more problems, especially with the facial pain and headache along with the congestion, CT scan of the sinuses might be indicated.  Return as needed   If you have lab work done today you will be contacted with your lab results within the next 2 weeks.  If you have not heard from [place] then please contact us. The fastest way to get your results is to register for My Chart.   IF you received an x-ray today, you will receive an invoice from [facility]. Please contact [facility] at [phone] with questions or concerns regarding your invoice.   IF you received labwork today, you will receive an invoice from [place]. Please contact [facility] at [phone] with questions or concerns regarding your invoice.   Our billing staff will not be able to assist you with questions regarding bills from these companies.  You will be contacted with the lab results as soon as they are available. The fastest way to get your results is to activate your My Chart account. Instructions are located on the last page of this paperwork. If you have not heard from [place] regarding the results in 2 weeks, please contact this office.

## 2018-04-11 NOTE — Progress Notes (Signed)
Patient ID: Walter Evans, male    DOB: 14-Mar-1981  Age: 37 y.o. MRN: [id number]  Chief Complaint  Patient presents with  . Sinus Problem    cough,congestion x 3 months   . Headache    pressure headache from sinus pressure     Subjective:   Patient was here this summer with an episode of sinusitis that  he was treated with Augmentin.  He did better for several weeks.  Then he started getting congestion symptoms which he is treated with OTC Mucinex and Sudafed.  It is continued to bother him and over the last week or 2 been worse with "sinus headaches".  No fever.  Pain around and behind his right eye.  He is blowing purulent mucus as well as coughing up stuff.  Current allergies, medications, problem list, past/family and social histories reviewed.  Objective:  BP 126/90   Pulse 90   Temp 98.7 F (37.1 C) (Oral)   Resp 16   Ht 6\' 2"  (1.88 m)   Wt 211 lb (95.7 kg)   SpO2 97%   BMI 27.09 kg/m   No major acute distress.  He walks with a cane from a knee problem and disc problem.  His TMs are normal.  Nose mildly congested.  Throat not erythematous.  Neck supple without nodes or thyromegaly.  No major tenderness to percussion around his sinuses.  Chest is clear to auscultation.  Heart regular without murmur.  Assessment & Plan:   Assessment: 1. Rhinosinusitis   2. Nonintractable headache, unspecified chronicity pattern, unspecified headache type       Plan: Will treat, but if he is not doing better should get a sinus CT to be certain if that is the problem or not.  Instructions.  No orders of the defined types were placed in this encounter.   No orders of the defined types were placed in this encounter.        Patient Instructions     Drink plenty of fluids and get enough rest  Take acetaminophen (Tylenol) 500 mg 2 pills 3 times daily as needed for headache  Use the fluticasone nose spray 2 sprays in each nostril twice daily for about 4 days, then reduce to  daily use  Take the Augmentin (amoxicillin/clavulanate) 1 twice daily at breakfast and supper for antibiotic  If you continue to have more problems, especially with the facial pain and headache along with the congestion, CT scan of the sinuses might be indicated.  Return as needed   If you have lab work done today you will be contacted with your lab results within the next 2 weeks.  If you have not heard from [place] then please contact us. The fastest way to get your results is to register for My Chart.   IF you received an x-ray today, you will receive an invoice from [facility]. Please contact [facility]iology at [phone] with questions or concerns regarding your invoice.   IF you received labwork today, you will receive an invoice from [place]. Please contact [facility] at [phone] with questions or concerns regarding your invoice.   Our billing staff will not be able to assist you with questions regarding bills from these companies.  You will be contacted with the lab results as soon as they are available. The fastest way to get your results is to activate your My Chart account. Instructions are located on the last page of this paperwork. If you have not heard from [place] regarding  the results in 2 weeks, please contact this office.         Return if symptoms worsen or fail to improve.   Ruben Reason, MD 04/11/2018

## 2018-08-10 ENCOUNTER — Encounter: Payer: Self-pay | Admitting: Family Medicine

## 2018-08-10 ENCOUNTER — Ambulatory Visit: Payer: No Typology Code available for payment source | Admitting: Family Medicine

## 2018-08-10 VITALS — BP 148/88 | HR 100 | Temp 98.1°F | Resp 16 | Ht 74.0 in | Wt 215.0 lb

## 2018-08-10 DIAGNOSIS — R233 Spontaneous ecchymoses: Secondary | ICD-10-CM | POA: Diagnosis not present

## 2018-08-10 DIAGNOSIS — R21 Rash and other nonspecific skin eruption: Secondary | ICD-10-CM

## 2018-08-10 DIAGNOSIS — Z114 Encounter for screening for human immunodeficiency virus [HIV]: Secondary | ICD-10-CM

## 2018-08-10 LAB — CBC WITH DIFFERENTIAL/PLATELET
Basophils Absolute: 0 10*3/uL (ref 0.0–0.2)
Basos: 0 %
EOS (ABSOLUTE): 0.2 10*3/uL (ref 0.0–0.4)
Eos: 3 %
Hematocrit: 45.9 % (ref 37.5–51.0)
Hemoglobin: 15.7 g/dL (ref 13.0–17.7)
LYMPHS ABS: 0.9 10*3/uL (ref 0.7–3.1)
Lymphs: 18 %
MCH: 28.4 pg (ref 26.6–33.0)
MCHC: 34.2 g/dL (ref 31.5–35.7)
MCV: 83 fL (ref 79–97)
MONOS ABS: 0.6 10*3/uL (ref 0.1–0.9)
Monocytes: 13 %
Neutrophils Absolute: 3 10*3/uL (ref 1.4–7.0)
Neutrophils: 66 %
PLATELETS: 282 10*3/uL (ref 150–450)
RBC: 5.53 x10E6/uL (ref 4.14–5.80)
RDW: 14 % (ref 11.6–15.4)
WBC: 4.7 10*3/uL (ref 3.4–10.8)

## 2018-08-10 LAB — POCT URINALYSIS DIP (MANUAL ENTRY)
Bilirubin, UA: NEGATIVE
Blood, UA: NEGATIVE
Glucose, UA: NEGATIVE mg/dL — AB
Ketones, POC UA: NEGATIVE mg/dL
Leukocytes, UA: NEGATIVE
Nitrite, UA: NEGATIVE
PH UA: 6.5 (ref 5.0–8.0)
Protein Ur, POC: NEGATIVE mg/dL
Spec Grav, UA: 1.02 (ref 1.010–1.025)
UROBILINOGEN UA: 0.2 U/dL

## 2018-08-10 LAB — POCT SEDIMENTATION RATE: POCT SED RATE: 4 mm/hr (ref 0–22)

## 2018-08-10 MED ORDER — PREDNISONE 20 MG PO TABS: ORAL_TABLET | ORAL | 0 refills | Status: DC

## 2018-08-10 NOTE — Patient Instructions (Addendum)
   Start prednisone for the rash. Ok to use Benadryl at night or even during the day if needed.  I will check some other tests as we discussed today but I would like to see you back in 3 days to make sure this is not continuing to worsen.  Any worsening symptoms over the weekend, proceed to emergency room.     If you have lab work done today you will be contacted with your lab results within the next 2 weeks.  If you have not heard from [place] then please contact us. The fastest way to get your results is to register for My Chart.   IF you received an x-ray today, you will receive an invoice from [facility]. Please contact [facility] Radiology at [phone] with questions or concerns regarding your invoice.   IF you received labwork today, you will receive an invoice from [place]. Please contact [facility] at [phone] with questions or concerns regarding your invoice.   Our billing staff will not be able to assist you with questions regarding bills from these companies.  You will be contacted with the lab results as soon as they are available. The fastest way to get your results is to activate your My Chart account. Instructions are located on the last page of this paperwork. If you have not heard from [place] regarding the results in 2 weeks, please contact this office.

## 2018-08-10 NOTE — Progress Notes (Signed)
Subjective:    Patient ID: Walter Evans, male    DOB: 09-05-1980, 38 y.o.   MRN: [id number]  HPI  Walter Evans is a 38 y.o. male Presents today for: Chief Complaint  Patient presents with  . Rash    x sunday, rash on hands,feet, and chest. red but not itchy. pt has hx of psoriasis   Hx psoriasis.   Presents with rash as above.  Woke up with painful rash on hands 5 days ago.  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 Uses kevlar lined leather gloves, but same gloves.  Conditioned with same silicone leather conditioner. Rash started moving up arm yesterday, then chest and feet yesterday.  Sore more than itching. Has noticed some swelling in wrists.  No genital/mouth lesions.  No respiratory sx's. No trouble swallowing. No new dermatologic products.  Free and clear detergent - nothing new.  No known poison ivy exposure.  No new Rx meds.   Occasional ibuprofen PM to sleep d/t back pain.  No fevers, feels well otherwise.  No recent tick exposure or foreign travel.  Slight itching now. 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 No new sexual partners since last STI testing (2017) but no recent RPR.   Tx:  Triamcinolone topically - up to 4x/day. No relief with hydrocortisone. Benadryl 50mg  once per day past 5 days  -  No relief of rash.    Review of Systems Per HPI.     Objective:   Physical Exam Constitutional:      General: He is not in acute distress.    Appearance: He is well-developed.  HENT:     Head: Normocephalic and atraumatic.     Mouth/Throat:     Comments: Few scattered petechiae at the roof of his mouth.  No posterior oropharynx erythema or exudate noted. Cardiovascular:     Rate and Rhythm: Normal rate.  Pulmonary:     Effort: Pulmonary effort is normal.  Skin:    Findings: Erythema and rash present.       Neurological:     Mental Status: He is alert and oriented to person, place, and time.    Results for orders placed or  performed in visit on 06/14/16  POCT SEDIMENTATION RATE  Result Value Ref Range   POCT SED RATE 6 0 - 22 mm/hr       Assessment & Plan:   Walter Evans is a 38 y.o. male Rash and nonspecific skin eruption - Plan: POCT SEDIMENTATION RATE, predniSONE (DELTASONE) 20 MG tablet, CANCELED: Sedimentation Rate  Petechiae of palate - Plan: POCT SEDIMENTATION RATE, Comprehensive metabolic panel, CBC with Differential/Platelet, CANCELED: POCT CBC, CANCELED: Sedimentation Rate  Petechiae - Plan: POCT SEDIMENTATION RATE, Comprehensive metabolic panel, CBC with Differential/Platelet, POCT urinalysis dipstick, CANCELED: POCT CBC  Rash of hands - Plan: RPR, POCT SEDIMENTATION RATE, predniSONE (DELTASONE) 20 MG tablet  Encounter for screening for HIV - Plan: HIV Antibody (routine testing w rflx)  Initially suspected form of contact dermatitis from something within the gloves but has diffuse symptoms including some small petechial lesions in soft palate.  ID reaction from the initial irritant/allergy possible.   -Reassuring CBC/platelets.  CMP, HIV testing RPR obtained given distribution of hand and foot rash as well.  Labs overall reassuring.  -Started on prednisone taper with potential side effects of meds discussed, continue Benadryl at bedtime or during the day if needed for itching and recheck in approximately 3 days.  RTC/ER  precautions if worsening sooner.   Meds ordered this encounter  Medications  . predniSONE (DELTASONE) 20 MG tablet    Sig: 3 by mouth for 3 days, then 2 by mouth for 2 days, then 1 by mouth for 2 days, then 1/2 by mouth for 2 days.    Dispense:  16 tablet    Refill:  0   Patient Instructions     Start prednisone for the rash. Ok to use Benadryl at night or even during the day if needed.  I will check some other tests as we discussed today but I would like to see you back in 3 days to make sure this is not continuing to worsen.  Any worsening symptoms over the weekend,  proceed to emergency room.     If you have lab work done today you will be contacted with your lab results within the next 2 weeks.  If you have not heard from [place] then please contact us. The fastest way to get your results is to register for My Chart.   IF you received an x-ray today, you will receive an invoice from [facility]. Please contact [facility] Radiology at [phone] with questions or concerns regarding your invoice.   IF you received labwork today, you will receive an invoice from [place]. Please contact [facility] at [phone] with questions or concerns regarding your invoice.   Our billing staff will not be able to assist you with questions regarding bills from these companies.  You will be contacted with the lab results as soon as they are available. The fastest way to get your results is to activate your My Chart account. Instructions are located on the last page of this paperwork. If you have not heard from [place] regarding the results in 2 weeks, please contact this office.      Signed,   Merri Ray, MD [facility] [place].  08/14/18 6:47 PM

## 2018-08-11 LAB — COMPREHENSIVE METABOLIC PANEL
ALT: 17 IU/L (ref 0–44)
AST: 14 IU/L (ref 0–40)
Albumin/Globulin Ratio: 2.4 — ABNORMAL HIGH (ref 1.2–2.2)
Albumin: 4.7 g/dL (ref 4.0–5.0)
Alkaline Phosphatase: 123 IU/L — ABNORMAL HIGH (ref 39–117)
BUN / CREAT RATIO: 11 (ref 9–20)
BUN: 10 mg/dL (ref 6–20)
Bilirubin Total: 0.2 mg/dL (ref 0.0–1.2)
CO2: 22 mmol/L (ref 20–29)
Calcium: 9.8 mg/dL (ref 8.7–10.2)
Chloride: 104 mmol/L (ref 96–106)
Creatinine, Ser: 0.94 mg/dL (ref 0.76–1.27)
GFR calc non Af Amer: 105 mL/min/{1.73_m2} (ref >59)
GFR, EST AFRICAN AMERICAN: 121 mL/min/{1.73_m2} (ref >59)
Globulin, Total: 2 g/dL (ref 1.5–4.5)
Glucose: 81 mg/dL (ref 65–99)
Potassium: 4.5 mmol/L (ref 3.5–5.2)
Sodium: 144 mmol/L (ref 134–144)
TOTAL PROTEIN: 6.7 g/dL (ref 6.0–8.5)

## 2018-08-11 LAB — RPR: RPR: NONREACTIVE

## 2018-08-11 LAB — HIV ANTIBODY (ROUTINE TESTING W REFLEX): HIV SCREEN 4TH GENERATION: NONREACTIVE

## 2018-08-14 ENCOUNTER — Other Ambulatory Visit: Payer: Self-pay

## 2018-08-14 ENCOUNTER — Encounter: Payer: Self-pay | Admitting: Family Medicine

## 2018-08-14 ENCOUNTER — Ambulatory Visit (INDEPENDENT_AMBULATORY_CARE_PROVIDER_SITE_OTHER): Payer: No Typology Code available for payment source | Admitting: Family Medicine

## 2018-08-14 VITALS — BP 129/86 | HR 77 | Temp 98.6°F | Resp 20 | Ht 72.56 in | Wt 211.2 lb

## 2018-08-14 DIAGNOSIS — R21 Rash and other nonspecific skin eruption: Secondary | ICD-10-CM | POA: Diagnosis not present

## 2018-08-14 DIAGNOSIS — L409 Psoriasis, unspecified: Secondary | ICD-10-CM | POA: Diagnosis not present

## 2018-08-14 MED ORDER — TRIAMCINOLONE ACETONIDE 0.025 % EX CREA: TOPICAL_CREAM | CUTANEOUS | 1 refills | Status: DC

## 2018-08-14 MED ORDER — TRIAMCINOLONE ACETONIDE 0.025 % EX LOTN: TOPICAL_LOTION | CUTANEOUS | 1 refills | Status: DC

## 2018-08-14 NOTE — Progress Notes (Signed)
Subjective:    Patient ID: Walter Evans, male    DOB: 02-21-1981, 38 y.o.   MRN: [id number]  HPI Walter Evans is a 38 y.o. male Presents today for: Chief Complaint  Patient presents with  . Rash    F/U- all over body   Here for follow-up of rash, see office visit January 31.  Diffuse rash at that time, initially had been on arms then spread.  Did have small area of possible petechial rash on the hands and upper palate.  No other systemic symptoms at that time.  Testing was performed, started on prednisone and continue Benadryl at night.  Reassuring testing below.  Rash has improved significantly. Swelling in hands had caused pain, but swelling down. Itching some, but improving.  Rash on feet worse, then better.  Benadryl topical helped.  Less sleep with prednisone,  But only 4 days left.   Has psoriasis, would like refill of triamcinolone. No dermatologist. Hx plaque psoriasis. TAC ointment to scalp once per week, and cream on face about once per week. Affects cheeks, eyebrows, face. No other affected areas.    Results for orders placed or performed in visit on 08/10/18  RPR  Result Value Ref Range   RPR Ser Ql Non Reactive Non Reactive  HIV Antibody (routine testing w rflx)  Result Value Ref Range   HIV Screen 4th Generation wRfx Non Reactive Non Reactive  Comprehensive metabolic panel  Result Value Ref Range   Glucose 81 65 - 99 mg/dL   BUN 10 6 - 20 mg/dL   Creatinine, Ser 0.94 0.76 - 1.27 mg/dL   GFR calc non Af Amer 105 >59 mL/min/1.73   GFR calc Af Amer 121 >59 mL/min/1.73   BUN/Creatinine Ratio 11 9 - 20   Sodium 144 134 - 144 mmol/L   Potassium 4.5 3.5 - 5.2 mmol/L   Chloride 104 96 - 106 mmol/L   CO2 22 20 - 29 mmol/L   Calcium 9.8 8.7 - 10.2 mg/dL   Total Protein 6.7 6.0 - 8.5 g/dL   Albumin 4.7 4.0 - 5.0 g/dL   Globulin, Total 2.0 1.5 - 4.5 g/dL   Albumin/Globulin Ratio 2.4 (H) 1.2 - 2.2   Bilirubin Total 0.2 0.0 - 1.2 mg/dL   Alkaline Phosphatase 123  (H) 39 - 117 IU/L   AST 14 0 - 40 IU/L   ALT 17 0 - 44 IU/L  CBC with Differential/Platelet  Result Value Ref Range   WBC 4.7 3.4 - 10.8 x10E3/uL   RBC 5.53 4.14 - 5.80 x10E6/uL   Hemoglobin 15.7 13.0 - 17.7 g/dL   Hematocrit 45.9 37.5 - 51.0 %   MCV 83 79 - 97 fL   MCH 28.4 26.6 - 33.0 pg   MCHC 34.2 31.5 - 35.7 g/dL   RDW 14.0 11.6 - 15.4 %   Platelets 282 150 - 450 x10E3/uL   Neutrophils 66 Not Estab. %   Lymphs 18 Not Estab. %   Monocytes 13 Not Estab. %   Eos 3 Not Estab. %   Basos 0 Not Estab. %   Neutrophils Absolute 3.0 1.4 - 7.0 x10E3/uL   Lymphocytes Absolute 0.9 0.7 - 3.1 x10E3/uL   Monocytes Absolute 0.6 0.1 - 0.9 x10E3/uL   EOS (ABSOLUTE) 0.2 0.0 - 0.4 x10E3/uL   Basophils Absolute 0.0 0.0 - 0.2 x10E3/uL  POCT SEDIMENTATION RATE  Result Value Ref Range   POCT SED RATE 4 0 - 22 mm/hr  POCT urinalysis dipstick  Result Value Ref Range   Color, UA yellow yellow   Clarity, UA clear clear   Glucose, UA negative (A) negative mg/dL   Bilirubin, UA negative negative   Ketones, POC UA negative negative mg/dL   Spec Grav, UA 1.020 1.010 - 1.025   Blood, UA negative negative   pH, UA 6.5 5.0 - 8.0   Protein Ur, POC negative negative mg/dL   Urobilinogen, UA 0.2 0.2 or 1.0 E.U./dL   Nitrite, UA Negative Negative   Leukocytes, UA Negative Negative    Patient Active Problem List   Diagnosis Date Noted  . Immune to hepatitis B 02/11/2016  . LBP radiating to right leg 02/06/2016  . BMI 28.0-28.9,adult 02/06/2016  . Memory loss 05/08/2014  . Cognitive decline 01/01/2014  . Psoriasis 11/29/2013  . Sinusitis, chronic 01/03/2012  . 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 Allergy   . Arthritis    RIGHT knee and back  . Back pain    L3-L4  . HSV-2 (herpes simplex virus 2) infection   . MRSA (methicillin resistant Staphylococcus aureus)   .  Psoriasis    Past Surgical History:  Procedure Laterality Date  . TONSILLECTOMY  2007  . WISDOM TOOTH EXTRACTION  2003   Allergies  Allergen Reactions  . Shellfish Allergy     unknown   Prior to Admission medications   Medication Sig Start Date End Date Taking? Authorizing Provider  predniSONE (DELTASONE) 20 MG tablet 3 by mouth for 3 days, then 2 by mouth for 2 days, then 1 by mouth for 2 days, then 1/2 by mouth for 2 days. 08/10/18   Wendie Agreste, MD  valACYclovir (VALTREX) 500 MG tablet Take 1 tablet (500 mg total) by mouth daily. 07/05/17   Harrison Mons, PA   Social History   Socioeconomic History  . Marital status: Married    Spouse name: Walter Evans   . Number of children: 0  . Years of education: 43  . Highest education level: Not on file  Occupational History  . Occupation: Production manager  Social Needs  . Financial resource strain: Not on file  . Food insecurity:    Worry: Not on file    Inability: Not on file  . Transportation needs:    Medical: Not on file    Non-medical: Not on file  Tobacco Use  . Smoking status: Never Smoker  . Smokeless tobacco: Never Used  Substance and Sexual Activity  . Alcohol use: Yes    Alcohol/week: 0.0 - 2.0 standard drinks  . Drug use: No  . Sexual activity: Yes    Partners: Male, Male  Lifestyle  . Physical activity:    Days per week: Not on file    Minutes per session: Not on file  . Stress: Not on file  Relationships  . Social connections:    Talks on phone: Not on file    Gets together: Not on file    Attends religious service: Not on file    Active member of club or organization: Not on file    Attends meetings of clubs or organizations: Not on file    Relationship status: Not on file  . Intimate partner violence:    Fear of current or ex partner: Not on file    Emotionally abused: Not on file    Physically abused: Not on file  Forced sexual activity: Not on file  Other Topics Concern  . Not on  file  Social History Narrative   Patient lives at home with wife Walter Evans.   Patient is ambidextrous    I'm a moderately active vegetarian. But I like [place]. But I drink a lot of water.   1 cup of coffee, 6-7 cups of tea/day.    Review of Systems Per HPI     Objective:   Physical Exam Vitals signs reviewed.  Constitutional:      General: He is not in acute distress.    Appearance: He is well-developed.  HENT:     Head: Normocephalic and atraumatic.     Mouth/Throat:     Mouth: Mucous membranes are moist.     Pharynx: Oropharynx is clear.     Comments: No petechial rash Cardiovascular:     Rate and Rhythm: Normal rate.  Pulmonary:     Effort: Pulmonary effort is normal.  Skin:    Comments: Very faint fine papules over the dorsal distal fingers with possible faint petechial areas but fairly clear.  Remainder of hands with very few fine papules.  Neurological:     Mental Status: He is alert and oriented to person, place, and time.    Vitals:   08/14/18 1559  BP: 129/86  Pulse: 77  Resp: 20  Temp: 98.6 F (37 C)  TempSrc: Oral  SpO2: 98%  Weight: 211 lb 3.2 oz (95.8 kg)  Height: 6' 0.56" (1.843 m)       Assessment & Plan:   Walter Evans is a 38 y.o. male Psoriasis - Plan: Triamcinolone Acetonide 0.025 % LOTN, triamcinolone (KENALOG) 0.025 % cream  Rash and nonspecific skin eruption   Previous reaction significantly improved, no significant petechiae noted on exam.  Mucosal membranes of mouth appear clear at this time.  Continue prednisone until finished, with RTC precautions if not continuing to improve.  Sooner if worse.  Triamcinolone cream and lotion provided for psoriasis outbreaks.  Borderline alk phos and albumin ratio.  Plan to recheck in the next few months for repeat eval and repeat lab testing.  Sooner if new symptoms    Meds ordered this encounter  Medications  . Triamcinolone Acetonide 0.025 % LOTN    Sig: Apply sparingly to affected  areas.    Dispense:  60 mL    Refill:  1  . triamcinolone (KENALOG) 0.025 % cream    Sig: Apply sparingly to affected areas.    Dispense:  30 g    Refill:  1   Patient Instructions   I am glad to hear that the rash is improving.  If it does not continue to improve as you finish the prednisone, or any worsening, return for recheck.  Follow-up in 2 months to repeat the alkaline phosphatase lab and can recheck the albumin level at that time.   Medications refilled for psoriasis for now.  Please schedule physical in the next 6 months with primary care provider.  Return to the clinic or go to the nearest emergency room if any of your symptoms worsen or new symptoms occur.   If you have lab work done today you will be contacted with your lab results within the next 2 weeks.  If you have not heard from [place] then please contact us. The fastest way to get your results is to register for My Chart.   IF you received an x-ray today, you will receive an invoice from  [facility]. Please contact [facility] at [phone] with questions or concerns regarding your invoice.   IF you received labwork today, you will receive an invoice from [place]. Please contact [facility] at [phone] with questions or concerns regarding your invoice.   Our billing staff will not be able to assist you with questions regarding bills from these companies.  You will be contacted with the lab results as soon as they are available. The fastest way to get your results is to activate your My Chart account. Instructions are located on the last page of this paperwork. If you have not heard from [place] regarding the results in 2 weeks, please contact this office.      Signed,   Merri Ray, MD [facility].  08/17/18 5:33 PM

## 2018-08-14 NOTE — Patient Instructions (Addendum)
I am glad to hear that the rash is improving.  If it does not continue to improve as you finish the prednisone, or any worsening, return for recheck.  Follow-up in 2 months to repeat the alkaline phosphatase lab and can recheck the albumin level at that time.   Medications refilled for psoriasis for now.  Please schedule physical in the next 6 months with primary care provider.  Return to the clinic or go to the nearest emergency room if any of your symptoms worsen or new symptoms occur.   If you have lab work done today you will be contacted with your lab results within the next 2 weeks.  If you have not heard from [place] then please contact us. The fastest way to get your results is to register for My Chart.   IF you received an x-ray today, you will receive an invoice from [facility]. Please contact [facility] at [phone] with questions or concerns regarding your invoice.   IF you received labwork today, you will receive an invoice from [place]. Please contact [facility] at [phone] with questions or concerns regarding your invoice.   Our billing staff will not be able to assist you with questions regarding bills from these companies.  You will be contacted with the lab results as soon as they are available. The fastest way to get your results is to activate your My Chart account. Instructions are located on the last page of this paperwork. If you have not heard from [place] regarding the results in 2 weeks, please contact this office.

## 2018-08-18 ENCOUNTER — Encounter: Payer: Self-pay | Admitting: Family Medicine

## 2018-08-27 IMAGING — DX DG SINUSES COMPLETE 3+V
4 series · 4 of 4 positions shown · non-contrast
Comparison: None.

CLINICAL DATA: Recurrent sinusitis x1 month

EXAM:
PARANASAL SINUSES - COMPLETE 3 + VIEW

[skull calldwell]
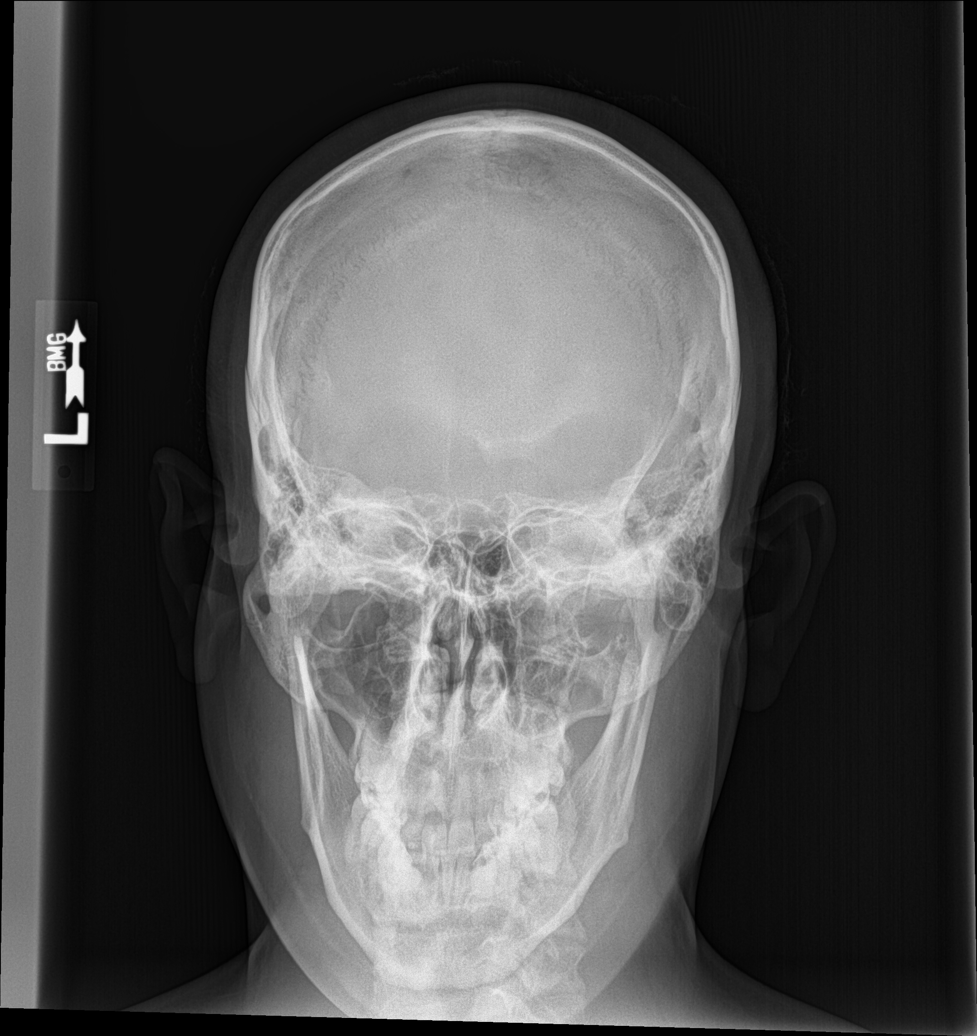

[skull waters]
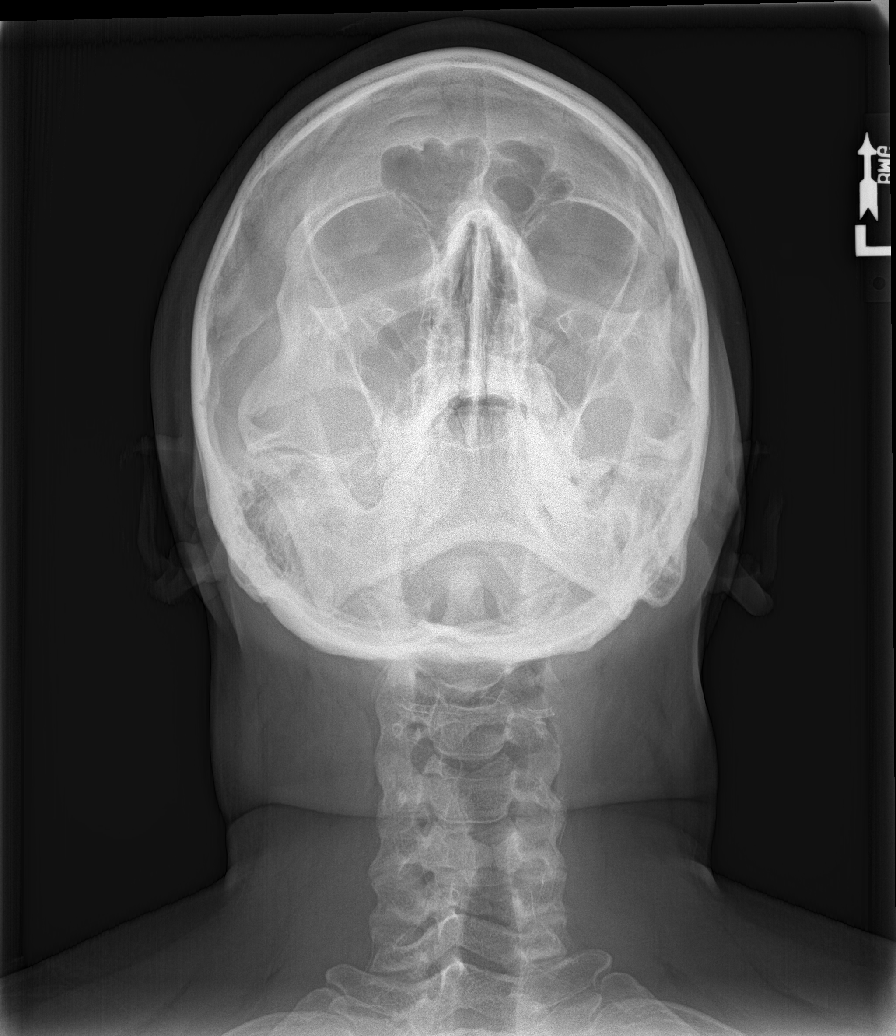

[skull lat]
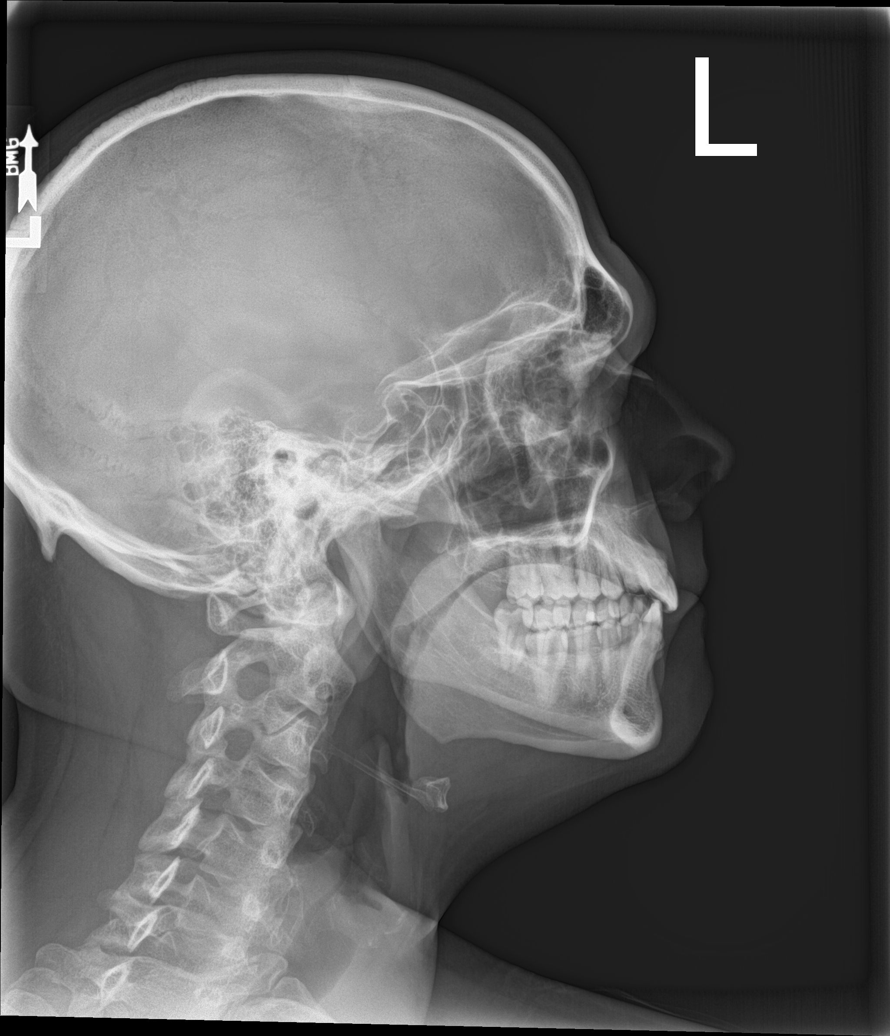

[skull smv]
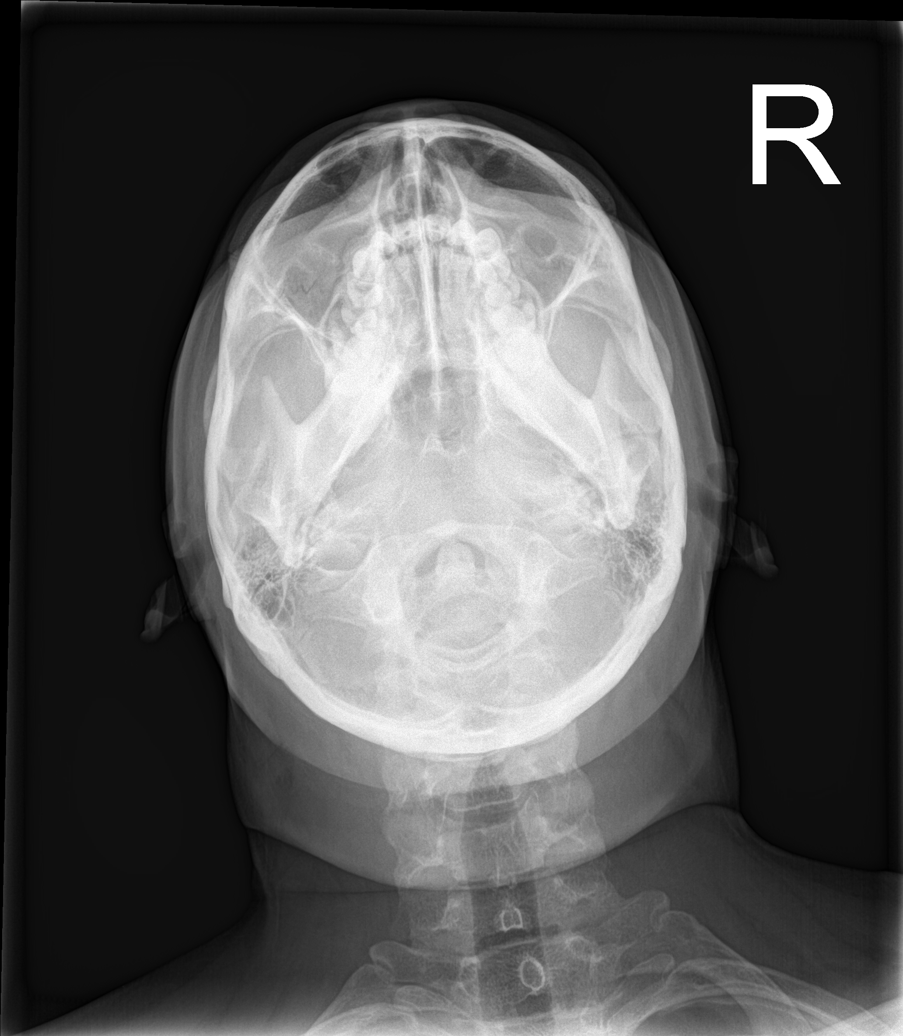

[4 of 4 positions shown; findings below may reference images not displayed]

FINDINGS: The paranasal sinus are aerated. There is no evidence of sinus
opacification air-fluid levels or significant mucosal thickening. No
significant bone abnormalities are seen.
IMPRESSION: No significant nor acute sinus mucosal abnormality.

## 2018-08-29 ENCOUNTER — Telehealth: Payer: Self-pay | Admitting: Family Medicine

## 2018-08-29 NOTE — Telephone Encounter (Signed)
Called to regarding their appt with Dr. Carlota Raspberry on 4/2. I was able to reschedule them for 4/3 with Dr. Carlota Raspberry. I advised of time and late policy. Pt acknowledged.

## 2018-10-11 ENCOUNTER — Ambulatory Visit: Payer: No Typology Code available for payment source | Admitting: Family Medicine

## 2018-10-11 ENCOUNTER — Ambulatory Visit (INDEPENDENT_AMBULATORY_CARE_PROVIDER_SITE_OTHER): Payer: No Typology Code available for payment source | Admitting: Family Medicine

## 2018-10-11 ENCOUNTER — Other Ambulatory Visit: Payer: Self-pay

## 2018-10-11 DIAGNOSIS — L409 Psoriasis, unspecified: Secondary | ICD-10-CM

## 2018-10-11 LAB — COMPREHENSIVE METABOLIC PANEL
ALT: 17 IU/L (ref 0–44)
AST: 20 IU/L (ref 0–40)
Albumin/Globulin Ratio: 2.5 — ABNORMAL HIGH (ref 1.2–2.2)
Albumin: 4.8 g/dL (ref 4.0–5.0)
Alkaline Phosphatase: 99 IU/L (ref 39–117)
BUN/Creatinine Ratio: 15 (ref 9–20)
BUN: 13 mg/dL (ref 6–20)
Bilirubin Total: 0.3 mg/dL (ref 0.0–1.2)
CO2: 23 mmol/L (ref 20–29)
Calcium: 10 mg/dL (ref 8.7–10.2)
Chloride: 99 mmol/L (ref 96–106)
Creatinine, Ser: 0.88 mg/dL (ref 0.76–1.27)
GFR calc Af Amer: 129 mL/min/{1.73_m2} (ref >59)
GFR calc non Af Amer: 111 mL/min/{1.73_m2} (ref >59)
Globulin, Total: 1.9 g/dL (ref 1.5–4.5)
Glucose: 74 mg/dL (ref 65–99)
Potassium: 4.5 mmol/L (ref 3.5–5.2)
Sodium: 140 mmol/L (ref 134–144)
Total Protein: 6.7 g/dL (ref 6.0–8.5)

## 2018-10-12 ENCOUNTER — Telehealth (INDEPENDENT_AMBULATORY_CARE_PROVIDER_SITE_OTHER): Payer: No Typology Code available for payment source | Admitting: Family Medicine

## 2018-10-12 ENCOUNTER — Encounter: Payer: Self-pay | Admitting: Family Medicine

## 2018-10-12 DIAGNOSIS — L409 Psoriasis, unspecified: Secondary | ICD-10-CM | POA: Diagnosis not present

## 2018-10-12 DIAGNOSIS — R748 Abnormal levels of other serum enzymes: Secondary | ICD-10-CM

## 2018-10-12 NOTE — Progress Notes (Signed)
Pt c/o follow up on blood work that he got yesterday.

## 2018-10-12 NOTE — Progress Notes (Signed)
Virtual Visit via Telephone Note  I connected with Walter Evans on 10/12/18 at 11:11 AM by telephone and verified that I am speaking with the correct person using two identifiers.   I discussed the limitations, risks, security and privacy concerns of performing an evaluation and management service by telephone and the availability of in person appointments. I also discussed with the patient that there may be a patient responsible charge related to this service. The patient expressed understanding and agreed to proceed, consent obtained  Chief complaint: Recheck on rash.   History of Present Illness:  Psoriasis/rash.  See last office visit .  Had previous rash/petechial rash treated with prednisone and Benadryl, significantly improving at follow-up visit on February 8, initially seen January 31.  He was continued on triamcinolone lotion and cream for psoriasis management.  no recurrence of rash. Thinks was reaction to old gloves. No recurrence.  Psoriasis is flaring with stress, but using cream and lotion effectively.   Elevated alk phos: Borderline elevated at 123 when checked in January.  Albumin/globulin ratio slightly elevated 2.4 but overall albumin was normal at 4.7 at that time.  Remainder of LFTs were normal.  Normalized on testing yesterday. Increasing hydration, no new abdominal or other symptoms.  Lab Results  Component Value Date   ALT 17 10/11/2018   AST 20 10/11/2018   ALKPHOS 99 10/11/2018   BILITOT 0.3 10/11/2018       Patient Active Problem List   Diagnosis Date Noted  . Immune to hepatitis B 02/11/2016  . LBP radiating to right leg 02/06/2016  . BMI 28.0-28.9,adult 02/06/2016  . Memory loss 05/08/2014  . Cognitive decline 01/01/2014  . Psoriasis 11/29/2013  . Sinusitis, chronic 01/03/2012  . 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 Allergy   . Arthritis    RIGHT knee and back  . Back pain    L3-L4  . HSV-2 (herpes simplex virus 2) infection   . MRSA (methicillin resistant Staphylococcus aureus)   . Psoriasis    Past Surgical History:  Procedure Laterality Date  . TONSILLECTOMY  2007  . WISDOM TOOTH EXTRACTION  2003   Allergies  Allergen Reactions  . Shellfish Allergy     unknown   Prior to Admission medications   Medication Sig Start Date End Date Taking? Authorizing Provider  predniSONE (DELTASONE) 20 MG tablet 3 by mouth for 3 days, then 2 by mouth for 2 days, then 1 by mouth for 2 days, then 1/2 by mouth for 2 days. 08/10/18  Yes Wendie Agreste, MD  triamcinolone (KENALOG) 0.025 % cream Apply sparingly to affected areas. 08/14/18  Yes Wendie Agreste, MD  Triamcinolone Acetonide 0.025 % LOTN Apply sparingly to affected areas. 08/14/18  Yes Wendie Agreste, MD  valACYclovir (VALTREX) 500 MG tablet Take 1 tablet (500 mg total) by mouth daily. 07/05/17  Yes Harrison Mons, PA   Social History   Socioeconomic History  . Marital status: Married    Spouse name: [name]   . Number of children: 0  . Years of education: 27  . Highest education level: Not on file  Occupational History  . Occupation: Production manager  Social Needs  . Financial resource strain: Not on file  . Food insecurity:    Worry: Not on file    Inability: Not on file  . Transportation needs:  Medical: Not on file    Non-medical: Not on file  Tobacco Use  . Smoking status: Never Smoker  . Smokeless tobacco: Never Used  Substance and Sexual Activity  . Alcohol use: Yes    Alcohol/week: 0.0 - 2.0 standard drinks  . Drug use: No  . Sexual activity: Yes    Partners: Male, Male  Lifestyle  . Physical activity:    Days per week: Not on file    Minutes per session: Not on file  . Stress: Not on file  Relationships  . Social connections:    Talks on phone: Not on file    Gets together: Not on file    Attends  religious service: Not on file    Active member of club or organization: Not on file    Attends meetings of clubs or organizations: Not on file    Relationship status: Not on file  . Intimate partner violence:    Fear of current or ex partner: Not on file    Emotionally abused: Not on file    Physically abused: Not on file    Forced sexual activity: Not on file  Other Topics Concern  . Not on file  Social History Narrative   Patient lives at home with wife [name].   Patient is ambidextrous    I'm a moderately active vegetarian. But I like [place]. But I drink a lot of water.   1 cup of coffee, 6-7 cups of tea/day.     Observations/Objective: No distress on phone.  Results for orders placed or performed in visit on 10/11/18  Comprehensive metabolic panel  Result Value Ref Range   Glucose 74 65 - 99 mg/dL   BUN 13 6 - 20 mg/dL   Creatinine, Ser 0.88 0.76 - 1.27 mg/dL   GFR calc non Af Amer 111 >59 mL/min/1.73   GFR calc Af Amer 129 >59 mL/min/1.73   BUN/Creatinine Ratio 15 9 - 20   Sodium 140 134 - 144 mmol/L   Potassium 4.5 3.5 - 5.2 mmol/L   Chloride 99 96 - 106 mmol/L   CO2 23 20 - 29 mmol/L   Calcium 10.0 8.7 - 10.2 mg/dL   Total Protein 6.7 6.0 - 8.5 g/dL   Albumin 4.8 4.0 - 5.0 g/dL   Globulin, Total 1.9 1.5 - 4.5 g/dL   Albumin/Globulin Ratio 2.5 (H) 1.2 - 2.2   Bilirubin Total 0.3 0.0 - 1.2 mg/dL   Alkaline Phosphatase 99 39 - 117 IU/L   AST 20 0 - 40 IU/L   ALT 17 0 - 44 IU/L     Assessment and Plan: Psoriasis  - slight flair recently but managing from home with prescriptions.  RTC/tele-med visit with FaceTime or WebEx may be beneficial if symptoms or not improving.  Understanding expressed.  Elevated alkaline phosphatase level  -Now improved.  Overall normal readings.  Albumin and globulin individual levels were normal, slight increased ratio.  No further work-up at this time, but plans on recheck in the next few months with repeat testing.  RTC  precautions if any new symptoms  Follow Up Instructions:    Patient Instructions  Continue treatments for psoriasis but let me know if those areas are not improving and we can certainly schedule a WebEx or FaceTime visit.  Labs looked good from yesterday.  Can recheck those in August at a physical.  Let me know if there are questions in the meantime.   I discussed the assessment and treatment  plan with the patient. The patient was provided an opportunity to ask questions and all were answered. The patient agreed with the plan and demonstrated an understanding of the instructions.   The patient was advised to call back or seek an in-person evaluation if the symptoms worsen or if the condition fails to improve as anticipated.  I provided 7 minutes of non-face-to-face time during this encounter.  Signed,   Merri Ray, MD [facility].  10/12/18

## 2018-10-12 NOTE — Patient Instructions (Signed)
Continue treatments for psoriasis but let me know if those areas are not improving and we can certainly schedule a WebEx or FaceTime visit.  Labs looked good from yesterday.  Can recheck those in August at a physical.  Let me know if there are questions in the meantime.

## 2019-02-28 ENCOUNTER — Other Ambulatory Visit: Payer: Self-pay

## 2019-02-28 ENCOUNTER — Telehealth (INDEPENDENT_AMBULATORY_CARE_PROVIDER_SITE_OTHER): Payer: No Typology Code available for payment source | Admitting: Family Medicine

## 2019-02-28 DIAGNOSIS — R05 Cough: Secondary | ICD-10-CM | POA: Diagnosis not present

## 2019-02-28 DIAGNOSIS — R059 Cough, unspecified: Secondary | ICD-10-CM

## 2019-02-28 DIAGNOSIS — J019 Acute sinusitis, unspecified: Secondary | ICD-10-CM | POA: Diagnosis not present

## 2019-02-28 MED ORDER — AMOXICILLIN-POT CLAVULANATE 875-125 MG PO TABS: 1.0000 | ORAL_TABLET | Freq: Two times a day (BID) | ORAL | 0 refills | Status: DC

## 2019-02-28 NOTE — Patient Instructions (Signed)
I ordered the covid test to be performed at [facility] (prior [facility]). That is a drive up test. Should have results in less than a week.   For sinus infection- start Augmentin, saline nasal wash. If not improving, I would recommend meeting with ENT. Additionally would recommend meeting with ENT to discuss chronic sinus issues. Let me know when I can refer you to their office and I can place that order when you are ready.

## 2019-02-28 NOTE — Progress Notes (Signed)
Virtual Visit via Telephone Note  I connected with Walter Evans on 02/28/19 at 6:06 PM by telephone and verified that I am speaking with the correct person using two identifiers.   I discussed the limitations, risks, security and privacy concerns of performing an evaluation and management service by telephone and the availability of in person appointments. I also discussed with the patient that there may be a patient responsible charge related to this service. The patient expressed understanding and agreed to proceed, consent obtained  Chief complaint: covid testing.   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   Cough with ongoing sinus infection, PND, notes more in am. no fever, no new cough.  No N/V/D No body aches, no change in taste/smell.  History of chronic sinusitis, prior pt of [facility]NT.  No recent antibiotics.  Yellow green nasal d/c since March/April.sinus pressure, but not usually having headaches.  Tx: mucinex and sudafed, (no decongestant nasal spray) occasionally.  Not using flonase - didn't seem to help.  Has motorized nasal spray for saline wash - not using recently - usually after dust exposure.   augmentin helped in past - tolerated ok.         Patient Active Problem List   Diagnosis Date Noted  . Immune to hepatitis B 02/11/2016  . LBP radiating to right leg 02/06/2016  . BMI 28.0-28.9,adult 02/06/2016  . Memory loss 05/08/2014  . Cognitive decline 01/01/2014  . Psoriasis 11/29/2013  . Sinusitis, chronic 01/03/2012  . 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 Allergy   . Arthritis    RIGHT knee and back  . Back  pain    L3-L4  . HSV-2 (herpes simplex virus 2) infection   . MRSA (methicillin resistant Staphylococcus aureus)   . Psoriasis    Past Surgical History:  Procedure Laterality Date  . TONSILLECTOMY  2007  . WISDOM TOOTH EXTRACTION  2003   Allergies  Allergen Reactions  . Shellfish Allergy     unknown   Prior to Admission medications   Medication Sig Start Date End Date Taking? Authorizing Provider  Triamcinolone Acetonide 0.025 % LOTN Apply sparingly to affected areas. 08/14/18  Yes Wendie Agreste, MD  valACYclovir (VALTREX) 500 MG tablet Take 1 tablet (500 mg total) by mouth daily. 07/05/17  Yes Jeffery, Chelle, PA  triamcinolone (KENALOG) 0.025 % cream Apply sparingly to affected areas. 08/14/18   Wendie Agreste, MD   Social History   Socioeconomic History  . Marital status: Married    Spouse name: [name]   . Number of children: 0  . Years of education: 69  . Highest education level: Not on file  Occupational History  . Occupation: Production manager  Social Needs  . Financial resource strain: Not on file  . Food insecurity    Worry: Not on file    Inability: Not on file  . Transportation needs    Medical: Not on file    Non-medical: Not on file  Tobacco Use  . Smoking status: Never Smoker  . Smokeless tobacco: Never Used  Substance and Sexual Activity  . Alcohol use: Yes    Alcohol/week: 0.0 - 2.0  standard drinks  . Drug use: No  . Sexual activity: Yes    Partners: Male, Male  Lifestyle  . Physical activity    Days per week: Not on file    Minutes per session: Not on file  . Stress: Not on file  Relationships  . Social Herbalist on phone: Not on file    Gets together: Not on file    Attends religious service: Not on file    Active member of club or organization: Not on file    Attends meetings of clubs or organizations: Not on file    Relationship status: Not on file  . Intimate partner violence    Fear of current or ex partner: Not  on file    Emotionally abused: Not on file    Physically abused: Not on file    Forced sexual activity: Not on file  Other Topics Concern  . Not on file  Social History Narrative   Patient lives at home with wife [name].   Patient is ambidextrous    I'm a moderately active vegetarian. But I like [place]. But I drink a lot of water.   1 cup of coffee, 6-7 cups of tea/day.     Observations/Objective: No distress. Speaking normally, all questions answered.   Assessment and Plan: Cough - Plan: Novel Coronavirus, NAA ([facility])  Subacute sinusitis, unspecified location - Plan: amoxicillin-clavulanate (AUGMENTIN) 875-125 MG tablet  -Subacute sinusitis likely with possible component of chronic sinusitis.  Discussed ENT follow-up, but would like to hold off, not appropriate time.  Can place referral when he is ready.  -For now agreed on Augmentin, saline nasal spray, potential side effects and risks of antibiotics discussed.  COVID testing ordered as will be in facility with patients at high risk.  Cough ongoing with sinus symptoms likely.   Follow Up Instructions: As needed.    Patient Instructions  I ordered the covid test to be performed at [facility] (prior [facility] That is a drive up test. Should have results in less than a week.   For sinus infection- start Augmentin, saline nasal wash. If not improving, I would recommend meeting with ENT. Additionally would recommend meeting with ENT to discuss chronic sinus issues. Let me know when I can refer you to their office and I can place that order when you are ready.       I discussed the assessment and treatment plan with the patient. The patient was provided an opportunity to ask questions and all were answered. The patient agreed with the plan and demonstrated an understanding of the instructions.   The patient was advised to call back or seek an in-person evaluation if the symptoms worsen or if the  condition fails to improve as anticipated.  I provided 22 minutes of non-face-to-face time during this encounter.  Signed,   Merri Ray, MD [facility] [place].  02/28/19

## 2019-02-28 NOTE — Progress Notes (Signed)
CC- order for Covid testing- No symptoms but work is requiring him to be tested. 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

## 2019-03-01 ENCOUNTER — Other Ambulatory Visit: Payer: Self-pay

## 2019-03-01 DIAGNOSIS — Z20822 Contact with and (suspected) exposure to covid-19: Secondary | ICD-10-CM

## 2019-03-02 LAB — NOVEL CORONAVIRUS, NAA: SARS-CoV-2, NAA: NOT DETECTED

## 2019-04-26 ENCOUNTER — Other Ambulatory Visit: Payer: Self-pay

## 2019-04-26 DIAGNOSIS — Z20822 Contact with and (suspected) exposure to covid-19: Secondary | ICD-10-CM

## 2019-04-28 LAB — NOVEL CORONAVIRUS, NAA: SARS-CoV-2, NAA: NOT DETECTED

## 2019-10-23 ENCOUNTER — Ambulatory Visit (INDEPENDENT_AMBULATORY_CARE_PROVIDER_SITE_OTHER): Payer: No Typology Code available for payment source | Admitting: Family Medicine

## 2019-10-23 ENCOUNTER — Encounter: Payer: Self-pay | Admitting: Family Medicine

## 2019-10-23 ENCOUNTER — Other Ambulatory Visit: Payer: Self-pay

## 2019-10-23 ENCOUNTER — Other Ambulatory Visit (HOSPITAL_COMMUNITY)
Admission: RE | Admit: 2019-10-23 | Discharge: 2019-10-23 | Disposition: A | Discharge status: Home / Self Care | Payer: No Typology Code available for payment source | Source: Ambulatory Visit | Attending: Family Medicine | Admitting: Family Medicine

## 2019-10-23 VITALS — BP 133/84 | HR 78 | Temp 98.6°F | Resp 16 | Ht 73.0 in | Wt 219.6 lb

## 2019-10-23 DIAGNOSIS — A6000 Herpesviral infection of urogenital system, unspecified: Secondary | ICD-10-CM

## 2019-10-23 DIAGNOSIS — Z113 Encounter for screening for infections with a predominantly sexual mode of transmission: Secondary | ICD-10-CM | POA: Diagnosis not present

## 2019-10-23 DIAGNOSIS — Z0000 Encounter for general adult medical examination without abnormal findings: Secondary | ICD-10-CM

## 2019-10-23 DIAGNOSIS — Z0001 Encounter for general adult medical examination with abnormal findings: Secondary | ICD-10-CM | POA: Diagnosis not present

## 2019-10-23 DIAGNOSIS — L409 Psoriasis, unspecified: Secondary | ICD-10-CM

## 2019-10-23 DIAGNOSIS — E782 Mixed hyperlipidemia: Secondary | ICD-10-CM

## 2019-10-23 DIAGNOSIS — Z131 Encounter for screening for diabetes mellitus: Secondary | ICD-10-CM

## 2019-10-23 MED ORDER — ACYCLOVIR 400 MG PO TABS: 400.0000 mg | ORAL_TABLET | Freq: Two times a day (BID) | ORAL | 3 refills | Status: DC

## 2019-10-23 MED ORDER — TRIAMCINOLONE ACETONIDE 0.025 % EX LOTN: TOPICAL_LOTION | CUTANEOUS | 1 refills | Status: DC

## 2019-10-23 NOTE — Progress Notes (Signed)
Subjective:  Patient ID: Walter Evans, male    DOB: [birth year]  Age: 39 y.o. MRN: [id number]  CC:  Chief Complaint  Patient presents with  . Annual Exam    wnats an albumin level, pt has no other concerns    HPI [name] presents for  Annual exam.   Psoriasis: Treated previously with triamcinolone 0.025% lotion or cream. Usually affects eyebrows, hairline. Responded to lotion in past. Uses every week or so, few applications only needed.  Cream for ears.   History of recurrent genital HSV: Valtrex 500 mg daily in past. Cost prohibitive. Now uses as needed only - taking every few months during flair. 7-8 flairs last year. Has online pharmacy that is less.   Immunization History  Administered Date(s) Administered  . Hepatitis A, Adult 08/02/2016, 03/16/2017  . Meningococcal Mcv4o 08/02/2016  . Tdap 02/12/2016  COVID-19 vaccine: s/p 1st vaccine 7 days ago.   Some seasonal allergies - minimal symptoms.   Hyperlipidemia: No currnet meds.  The ASCVD Risk score Mikey Bussing DC Jr., et al., 2013) failed to calculate for the following reasons:   The 2013 ASCVD risk score is only valid for ages 23 to 77  Lab Results  Component Value Date   CHOL 279 (H) 10/23/2019   HDL 51 10/23/2019   LDLCALC 153 (H) 10/23/2019   TRIG 402 (H) 10/23/2019   CHOLHDL 5.5 (H) 10/23/2019   Lab Results  Component Value Date   ALT 18 10/23/2019   AST 21 10/23/2019   ALKPHOS 113 10/23/2019   BILITOT 0.3 10/23/2019      STI testing: Not currently active. No new partners, but would like testing.   Depression screen [facility] 2/9 10/23/2019 02/28/2019 08/14/2018 08/10/2018 04/11/2018  Decreased Interest 0 0 0 0 0  Down, Depressed, Hopeless 0 0 0 0 0  PHQ - 2 Score 0 0 0 0 0  Altered sleeping - - - - -  Tired, decreased energy - - - - -  Change in appetite - - - - -  Feeling bad or failure about yourself  - - - - -  Trouble concentrating - - - - -  Moving slowly or fidgety/restless - - - - -    Suicidal thoughts - - - - -  PHQ-9 Score - - - - -    Hearing Screening   125Hz  250Hz  500Hz  1000Hz  2000Hz  3000Hz  4000Hz  6000Hz  8000Hz   Right ear:           Left ear:             Visual Acuity Screening   Right eye Left eye Both eyes  Without correction: 20/13 20/15 20/13   With correction:     no recent optho eval.   Dental: no recent eval. Will need to find one without a tv.   Exercise: 2-3 days per week. Less with pandemic.     History Patient Active Problem List   Diagnosis Date Noted  . Immune to hepatitis B 02/11/2016  . LBP radiating to right leg 02/06/2016  . BMI 28.0-28.9,adult 02/06/2016  . Memory loss 05/08/2014  . Cognitive decline 01/01/2014  . Psoriasis 11/29/2013  . Sinusitis, chronic 01/03/2012  . 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 Allergy   . Arthritis    RIGHT knee and back  . Back pain    L3-L4  .  HSV-2 (herpes simplex virus 2) infection   . MRSA (methicillin resistant Staphylococcus aureus)   . Psoriasis    Past Surgical History:  Procedure Laterality Date  . TONSILLECTOMY  2007  . WISDOM TOOTH EXTRACTION  2003   Allergies  Allergen Reactions  . Shellfish Allergy     unknown   Prior to Admission medications   Medication Sig Start Date End Date Taking? Authorizing Provider  triamcinolone (KENALOG) 0.025 % cream Apply sparingly to affected areas. 08/14/18  Yes Wendie Agreste, MD  Triamcinolone Acetonide 0.025 % LOTN Apply sparingly to affected areas. 08/14/18  Yes Wendie Agreste, MD  valACYclovir (VALTREX) 500 MG tablet Take 1 tablet (500 mg total) by mouth daily. 07/05/17  Yes Jeffery, Chelle, PA  amoxicillin-clavulanate (AUGMENTIN) 875-125 MG tablet Take 1 tablet by mouth 2 (two) times daily. Patient not taking: Reported on 10/23/2019 02/28/19   Wendie Agreste, MD   Social History   Socioeconomic History   . Marital status: Married    Spouse name: [name]   . Number of children: 0  . Years of education: 80  . Highest education level: Not on file  Occupational History  . Occupation: Production manager  Tobacco Use  . Smoking status: Never Smoker  . Smokeless tobacco: Never Used  Substance and Sexual Activity  . Alcohol use: Yes    Alcohol/week: 0.0 - 2.0 standard drinks  . Drug use: No  . Sexual activity: Yes    Partners: Male, Male  Other Topics Concern  . Not on file  Social History Narrative   Patient lives at home with wife [name].   Patient is ambidextrous    I'm a moderately active vegetarian. But I like [place]. But I drink a lot of water.   1 cup of coffee, 6-7 cups of tea/day.   Social Determinants of Health   Financial Resource Strain:   . Difficulty of Paying Living Expenses:   Food Insecurity:   . Worried About Charity fundraiser in the Last Year:   . Arboriculturist in the Last Year:   Transportation Needs:   . Film/video editor (Medical):   Marland Kitchen Lack of Transportation (Non-Medical):   Physical Activity:   . Days of Exercise per Week:   . Minutes of Exercise per Session:   Stress:   . Feeling of Stress :   Social Connections:   . Frequency of Communication with Friends and Family:   . Frequency of Social Gatherings with Friends and Family:   . Attends Religious Services:   . Active Member of Clubs or Organizations:   . Attends Archivist Meetings:   Marland Kitchen Marital Status:   Intimate Partner Violence:   . Fear of Current or Ex-Partner:   . Emotionally Abused:   Marland Kitchen Physically Abused:   . Sexually Abused:     Review of Systems 13 point review of systems per patient health survey.  Negative other than as indicated above or in HPI.    Objective:   Vitals:   10/23/19 1104  BP: 133/84  Pulse: 78  Resp: 16  Temp: 98.6 F (37 C)  TempSrc: Temporal  SpO2: 99%  Weight: 219 lb 9.6 oz (99.6 kg)  Height: 6\' 1"  (1.854 m)      Physical Exam Vitals reviewed.  Constitutional:      Appearance: He is well-developed.  HENT:     Head: Normocephalic and atraumatic.     Right Ear: External ear  normal.     Left Ear: External ear normal.  Eyes:     Conjunctiva/sclera: Conjunctivae normal.     Pupils: Pupils are equal, round, and reactive to light.  Neck:     Thyroid: No thyromegaly.  Cardiovascular:     Rate and Rhythm: Normal rate and regular rhythm.     Heart sounds: Normal heart sounds.  Pulmonary:     Effort: Pulmonary effort is normal. No respiratory distress.     Breath sounds: Normal breath sounds. No wheezing.  Abdominal:     General: There is no distension.     Palpations: Abdomen is soft.     Tenderness: There is no abdominal tenderness.     Hernia: A hernia is present.  Musculoskeletal:        General: No tenderness. Normal range of motion.     Cervical back: Normal range of motion and neck supple.  Lymphadenopathy:     Cervical: No cervical adenopathy.  Skin:    General: Skin is warm and dry.  Neurological:     Mental Status: He is alert and oriented to person, place, and time.     Deep Tendon Reflexes: Reflexes are normal and symmetric.  Psychiatric:        Behavior: Behavior normal.        Assessment & Plan:  OWEN CAUGHLIN is a 39 y.o. male . Annual physical exam  - -anticipatory guidance as below in AVS, screening labs above. Health maintenance items as above in HPI discussed/recommended as applicable.   Psoriasis - Plan: Triamcinolone Acetonide 0.025 % LOTN refilled as needed. Cream refill if needed as well - min use.   Recurrent genital HSV (herpes simplex virus) infection - Plan: acyclovir (ZOVIRAX) 400 MG tablet  -Valtrex somewhat cost prohibitive, can try acyclovir with costly alternative, appropriate dosing instructions were discussed.  Routine screening for STI (sexually transmitted infection) - Plan: GC/Chlamydia probe amp ([place])not at [facility], HIV Antibody  (routine testing w rflx), RPR  Screening for diabetes mellitus - Plan: Comprehensive metabolic panel  Mixed hyperlipidemia - Plan: Lipid panel  -Check labs to determine statin recommendations.  Based on age would typically recommend diet/exercise approach initially.  Meds ordered this encounter  Medications  . Triamcinolone Acetonide 0.025 % LOTN    Sig: Apply sparingly to affected areas.    Dispense:  60 mL    Refill:  1  . acyclovir (ZOVIRAX) 400 MG tablet    Sig: Take 1 tablet (400 mg total) by mouth 2 (two) times daily. Increase to 2 pills twice per day for 5 days if flair.    Dispense:  180 tablet    Refill:  3   Patient Instructions   Try acyclovir 2 times per day for suppression. If flair occurs, increase to 2 pills twice per day for 5 days.   Triamcinolone refilled if needed.   I do recommend dental eval.   Thanks for coming in today and take care.     Keeping you healthy  Get these tests  Blood pressure- Have your blood pressure checked once a year by your healthcare provider.  Normal blood pressure is 120/80.  Weight- Have your body mass index (BMI) calculated to screen for obesity.  BMI is a measure of body fat based on height and weight. You can also calculate your own BMI at [employer].it.  Cholesterol- Have your cholesterol checked regularly starting at age 10, sooner may be necessary if you have diabetes, high blood pressure, if  a family member developed heart diseases at an early age or if you smoke.   Chlamydia, HIV, and other sexual transmitted disease- Get screened each year until the age of 33 then within three months of each new sexual partner.  Diabetes- Have your blood sugar checked regularly if you have high blood pressure, high cholesterol, a family history of diabetes or if you are overweight.  Get these vaccines  Flu shot- Every fall.  Tetanus shot- Every 10 years.  Menactra- Single dose; prevents meningitis.  Take these  steps  Don't smoke- If you do smoke, ask your healthcare provider about quitting. For tips on how to quit, go to www.smokefree.gov or call 1-800-QUIT-NOW.  Be physically active- Exercise 5 days a week for at least 30 minutes.  If you are not already physically active start slow and gradually work up to 30 minutes of moderate physical activity.  Examples of moderate activity include walking briskly, mowing the yard, dancing, swimming bicycling, etc.  Eat a healthy diet- Eat a variety of healthy foods such as fruits, vegetables, low fat milk, low fat cheese, yogurt, lean meats, poultry, fish, beans, tofu, etc.  For more information on healthy eating, go to www.thenutritionsource.org  Drink alcohol in moderation- Limit alcohol intake two drinks or less a day.  Never drink and drive.  Dentist- Brush and floss teeth twice daily; visit your dentis twice a year.  Depression-Your emotional health is as important as your physical health.  If you're feeling down, losing interest in things you normally enjoy please talk with your healthcare provider.  Gun Safety- If you keep a gun in your home, keep it unloaded and with the safety lock on.  Bullets should be stored separately.  Helmet use- Always wear a helmet when riding a motorcycle, bicycle, rollerblading or skateboarding.  Safe sex- If you may be exposed to a sexually transmitted infection, use a condom  Seat belts- Seat bels can save your life; always wear one.  Smoke/Carbon Monoxide detectors- These detectors need to be installed on the appropriate level of your home.  Replace batteries at least once a year.  Skin Cancer- When out in the sun, cover up and use sunscreen SPF 15 or higher.  Violence- If anyone is threatening or hurting you, please tell your healthcare provider.   If you have lab work done today you will be contacted with your lab results within the next 2 weeks.  If you have not heard from [place] then please contact us. The fastest  way to get your results is to register for My Chart.   IF you received an x-ray today, you will receive an invoice from [facility]. Please contact [facility]iology at [phone] with questions or concerns regarding your invoice.   IF you received labwork today, you will receive an invoice from [place]. Please contact [facility] at [phone] with questions or concerns regarding your invoice.   Our billing staff will not be able to assist you with questions regarding bills from these companies.  You will be contacted with the lab results as soon as they are available. The fastest way to get your results is to activate your My Chart account. Instructions are located on the last page of this paperwork. If you have not heard from [place] regarding the results in 2 weeks, please contact this office.         Signed, Merri Ray, MD [facility]

## 2019-10-23 NOTE — Patient Instructions (Addendum)
Try acyclovir 2 times per day for suppression. If flair occurs, increase to 2 pills twice per day for 5 days.   Triamcinolone refilled if needed.   I do recommend dental eval.   Thanks for coming in today and take care.     Keeping you healthy  Get these tests  Blood pressure- Have your blood pressure checked once a year by your healthcare provider.  Normal blood pressure is 120/80.  Weight- Have your body mass index (BMI) calculated to screen for obesity.  BMI is a measure of body fat based on height and weight. You can also calculate your own BMI at [employer].  Cholesterol- Have your cholesterol checked regularly starting at age 34, sooner may be necessary if you have diabetes, high blood pressure, if a family member developed heart diseases at an early age or if you smoke.   Chlamydia, HIV, and other sexual transmitted disease- Get screened each year until the age of 53 then within three months of each new sexual partner.  Diabetes- Have your blood sugar checked regularly if you have high blood pressure, high cholesterol, a family history of diabetes or if you are overweight.  Get these vaccines  Flu shot- Every fall.  Tetanus shot- Every 10 years.  Menactra- Single dose; prevents meningitis.  Take these steps  Don't smoke- If you do smoke, ask your healthcare provider about quitting. For tips on how to quit, go to www.smokefree.gov or call 1-800-QUIT-NOW.  Be physically active- Exercise 5 days a week for at least 30 minutes.  If you are not already physically active start slow and gradually work up to 30 minutes of moderate physical activity.  Examples of moderate activity include walking briskly, mowing the yard, dancing, swimming bicycling, etc.  Eat a healthy diet- Eat a variety of healthy foods such as fruits, vegetables, low fat milk, low fat cheese, yogurt, lean meats, poultry, fish, beans, tofu, etc.  For more information on healthy eating, go to  www.thenutritionsource.org  Drink alcohol in moderation- Limit alcohol intake two drinks or less a day.  Never drink and drive.  Dentist- Brush and floss teeth twice daily; visit your dentis twice a year.  Depression-Your emotional health is as important as your physical health.  If you're feeling down, losing interest in things you normally enjoy please talk with your healthcare provider.  Gun Safety- If you keep a gun in your home, keep it unloaded and with the safety lock on.  Bullets should be stored separately.  Helmet use- Always wear a helmet when riding a motorcycle, bicycle, rollerblading or skateboarding.  Safe sex- If you may be exposed to a sexually transmitted infection, use a condom  Seat belts- Seat bels can save your life; always wear one.  Smoke/Carbon Monoxide detectors- These detectors need to be installed on the appropriate level of your home.  Replace batteries at least once a year.  Skin Cancer- When out in the sun, cover up and use sunscreen SPF 15 or higher.  Violence- If anyone is threatening or hurting you, please tell your healthcare provider.   If you have lab work done today you will be contacted with your lab results within the next 2 weeks.  If you have not heard from [place] then please contact us. The fastest way to get your results is to register for My Chart.   IF you received an x-ray today, you will receive an invoice from [facility] Rad[facility]. Please contact [facility] Radiology at [phone] with questions or  concerns regarding your invoice.   IF you received labwork today, you will receive an invoice from [place]. Please contact [facility] at [phone] with questions or concerns regarding your invoice.   Our billing staff will not be able to assist you with questions regarding bills from these companies.  You will be contacted with the lab results as soon as they are available. The fastest way to get your results is to activate your My Chart  account. Instructions are located on the last page of this paperwork. If you have not heard from [place] regarding the results in 2 weeks, please contact this office.

## 2019-10-24 LAB — COMPREHENSIVE METABOLIC PANEL
ALT: 18 IU/L (ref 0–44)
AST: 21 IU/L (ref 0–40)
Albumin/Globulin Ratio: 2.6 — ABNORMAL HIGH (ref 1.2–2.2)
Albumin: 5.2 g/dL — ABNORMAL HIGH (ref 4.0–5.0)
Alkaline Phosphatase: 113 IU/L (ref 39–117)
BUN/Creatinine Ratio: 10 (ref 9–20)
BUN: 9 mg/dL (ref 6–20)
Bilirubin Total: 0.3 mg/dL (ref 0.0–1.2)
CO2: 23 mmol/L (ref 20–29)
Calcium: 10.2 mg/dL (ref 8.7–10.2)
Chloride: 102 mmol/L (ref 96–106)
Creatinine, Ser: 0.91 mg/dL (ref 0.76–1.27)
GFR calc Af Amer: 125 mL/min/{1.73_m2} (ref >59)
GFR calc non Af Amer: 108 mL/min/{1.73_m2} (ref >59)
Globulin, Total: 2 g/dL (ref 1.5–4.5)
Glucose: 91 mg/dL (ref 65–99)
Potassium: 4.4 mmol/L (ref 3.5–5.2)
Sodium: 143 mmol/L (ref 134–144)
Total Protein: 7.2 g/dL (ref 6.0–8.5)

## 2019-10-24 LAB — LIPID PANEL
Chol/HDL Ratio: 5.5 ratio — ABNORMAL HIGH (ref 0.0–5.0)
Cholesterol, Total: 279 mg/dL — ABNORMAL HIGH (ref 100–199)
HDL: 51 mg/dL (ref >39)
LDL Chol Calc (NIH): 153 mg/dL — ABNORMAL HIGH (ref 0–99)
Triglycerides: 402 mg/dL — ABNORMAL HIGH (ref 0–149)
VLDL Cholesterol Cal: 75 mg/dL — ABNORMAL HIGH (ref 5–40)

## 2019-10-24 LAB — RPR: RPR Ser Ql: NONREACTIVE

## 2019-10-24 LAB — GC/CHLAMYDIA PROBE AMP (~~LOC~~) NOT AT ARMC
Chlamydia: NEGATIVE
Comment: NEGATIVE
Comment: NORMAL
Neisseria Gonorrhea: NEGATIVE

## 2019-10-24 LAB — HIV ANTIBODY (ROUTINE TESTING W REFLEX): HIV Screen 4th Generation wRfx: NONREACTIVE

## 2019-10-26 ENCOUNTER — Encounter: Payer: Self-pay | Admitting: Family Medicine

## 2019-11-01 ENCOUNTER — Other Ambulatory Visit: Payer: Self-pay | Admitting: Family Medicine

## 2019-11-01 DIAGNOSIS — L409 Psoriasis, unspecified: Secondary | ICD-10-CM

## 2019-11-18 ENCOUNTER — Telehealth: Payer: Self-pay | Admitting: Family Medicine

## 2019-11-18 NOTE — Telephone Encounter (Signed)
Patient is calling regarding a bill with DOS of 2019-11-06 as it is coded has service location is [facility] with was done at [facility]. But is was part of his CPE. It was billed as an additional lab work done at [facility]. However it was part of his CPE. [employer] does not see the bill as linked because it was lab work was billed as a hospital visit.  Please advise CB- [phone]

## 2019-11-18 NOTE — Telephone Encounter (Signed)
Pt has concern about billing

## 2019-11-18 NOTE — Telephone Encounter (Signed)
Getting the info so matter can be sent to the correct dept to rectify pt matter

## 2019-12-16 NOTE — Telephone Encounter (Signed)
Spoke with patient. He states his insurance received the claim as a hospital visit and the total was $60.32. He also received a bill from [facility] for $9.49. The acct number on the [place] is [id number]. The number he called is [phone]. He said his security code is [id number].

## 2020-01-17 ENCOUNTER — Emergency Department (HOSPITAL_COMMUNITY)
Admission: EM | Admit: 2020-01-17 | Discharge: 2020-01-18 | Disposition: A | Discharge status: Home / Self Care | Payer: No Typology Code available for payment source | Attending: Emergency Medicine | Admitting: Emergency Medicine

## 2020-01-17 ENCOUNTER — Other Ambulatory Visit: Payer: Self-pay

## 2020-01-17 ENCOUNTER — Encounter (HOSPITAL_COMMUNITY): Payer: Self-pay

## 2020-01-17 DIAGNOSIS — Z5321 Procedure and treatment not carried out due to patient leaving prior to being seen by health care provider: Secondary | ICD-10-CM | POA: Insufficient documentation

## 2020-01-17 DIAGNOSIS — R208 Other disturbances of skin sensation: Secondary | ICD-10-CM | POA: Diagnosis not present

## 2020-01-17 NOTE — ED Triage Notes (Signed)
Pt sts laying down and felt a "heat/ burning feeling" and arms were red and broke out in hives. Pt took 75mg  benadryl and a tylenol pm at 2315. Allergies to thc and shellfish.

## 2020-01-18 NOTE — ED Notes (Addendum)
Pt notified registration that he was leaving. RN unable to speak with patient.

## 2020-02-25 ENCOUNTER — Telehealth (INDEPENDENT_AMBULATORY_CARE_PROVIDER_SITE_OTHER): Payer: No Typology Code available for payment source | Admitting: Emergency Medicine

## 2020-02-25 ENCOUNTER — Other Ambulatory Visit: Payer: Self-pay

## 2020-02-25 ENCOUNTER — Encounter: Payer: Self-pay | Admitting: Emergency Medicine

## 2020-02-25 VITALS — Ht 72.0 in | Wt 210.0 lb

## 2020-02-25 DIAGNOSIS — J329 Chronic sinusitis, unspecified: Secondary | ICD-10-CM | POA: Diagnosis not present

## 2020-02-25 DIAGNOSIS — J310 Chronic rhinitis: Secondary | ICD-10-CM

## 2020-02-25 DIAGNOSIS — J0101 Acute recurrent maxillary sinusitis: Secondary | ICD-10-CM

## 2020-02-25 MED ORDER — AMOXICILLIN-POT CLAVULANATE 875-125 MG PO TABS: 1.0000 | ORAL_TABLET | Freq: Two times a day (BID) | ORAL | 1 refills | Status: AC

## 2020-02-25 NOTE — Progress Notes (Signed)
Telemedicine Encounter- SOAP NOTE Established Patient  This telephone encounter was conducted with the patient's (or proxy's) verbal consent via audio telecommunications: yes/no: Yes Patient was instructed to have this encounter in a suitably private space; and to only have persons present to whom they give permission to participate. In addition, patient identity was confirmed by use of name plus two identifiers (DOB and address).  I discussed the limitations, risks, security and privacy concerns of performing an evaluation and management service by telephone and the availability of in person appointments. I also discussed with the patient that there may be a patient responsible charge related to this service. The patient expressed understanding and agreed to proceed.  I spent a total of TIME; 0 MIN TO 60 MIN: 20 minutes talking with the patient or their proxy.  Chief Complaint  Patient presents with  . Cough    per patient started last Thursday productive and all he needs is an antibiotic  . Nasal Congestion    Subjective   Walter Evans is a 39 y.o. male established patient. Telephone visit today complaining of possible sinus infection.  Has history of recurrent sinusitis.  States finally went through flulike symptoms last couple weeks.  Now he has sinus pressure with cough and congestion.  Requesting antibiotic which always helps.  No other significant symptoms.  No other complaints or medical concerns today.  HPI   Patient Active Problem List   Diagnosis Date Noted  . Immune to hepatitis B 02/11/2016  . LBP radiating to right leg 02/06/2016  . BMI 28.0-28.9,adult 02/06/2016  . Memory loss 05/08/2014  . Cognitive decline 01/01/2014  . Psoriasis 11/29/2013  . Sinusitis, chronic 01/03/2012  . 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 Allergy   .  Arthritis    RIGHT knee and back  . Back pain    L3-L4  . HSV-2 (herpes simplex virus 2) infection   . MRSA (methicillin resistant Staphylococcus aureus)   . Psoriasis     Current Outpatient Medications  Medication Sig Dispense Refill  . acyclovir (ZOVIRAX) 400 MG tablet Take 1 tablet (400 mg total) by mouth 2 (two) times daily. Increase to 2 pills twice per day for 5 days if flair. (Patient not taking: Reported on 02/25/2020) 180 tablet 3  . triamcinolone (KENALOG) 0.025 % cream Apply sparingly to affected areas. (Patient not taking: Reported on 02/25/2020) 30 g 1  . Triamcinolone Acetonide 0.025 % LOTN Apply sparingly to affected areas. (Patient not taking: Reported on 02/25/2020) 60 mL 1  . valACYclovir (VALTREX) 500 MG tablet Take 1 tablet (500 mg total) by mouth daily. (Patient not taking: Reported on 02/25/2020) 90 tablet 3   No current facility-administered medications for this visit.    Allergies  Allergen Reactions  . Shellfish Allergy     unknown    Social History   Socioeconomic History  . Marital status: Married    Spouse name: [name]   . Number of children: 0  . Years of education: 74  . Highest education level: Not on file  Occupational History  . Occupation: Production manager  Tobacco Use  . Smoking status: Never Smoker  . Smokeless tobacco: Never Used  Vaping Use  . Vaping Use: Never used  Substance and Sexual Activity  . Alcohol use: Yes    Alcohol/week: 0.0 - 2.0 standard drinks  .  Drug use: No  . Sexual activity: Yes    Partners: Male, Male  Other Topics Concern  . Not on file  Social History Narrative   Patient lives at home with wife [name].   Patient is ambidextrous    I'm a moderately active vegetarian. But I like [place]. But I drink a lot of water.   1 cup of coffee, 6-7 cups of tea/day.   Social Determinants of Health   Financial Resource Strain:   . Difficulty of Paying Living Expenses:   Food Insecurity:   . Worried About  Charity fundraiser in the Last Year:   . Arboriculturist in the Last Year:   Transportation Needs:   . Film/video editor (Medical):   Marland Kitchen Lack of Transportation (Non-Medical):   Physical Activity:   . Days of Exercise per Week:   . Minutes of Exercise per Session:   Stress:   . Feeling of Stress :   Social Connections:   . Frequency of Communication with Friends and Family:   . Frequency of Social Gatherings with Friends and Family:   . Attends Religious Services:   . Active Member of Clubs or Organizations:   . Attends Archivist Meetings:   Marland Kitchen Marital Status:   Intimate Partner Violence:   . Fear of Current or Ex-Partner:   . Emotionally Abused:   Marland Kitchen Physically Abused:   . Sexually Abused:     Review of Systems  Constitutional: Negative.  Negative for chills and fever.  HENT: Positive for congestion, ear pain and sinus pain. Negative for sore throat.   Respiratory: Positive for cough. Negative for shortness of breath.   Cardiovascular: Negative.  Negative for chest pain and palpitations.  Gastrointestinal: Negative for abdominal pain, nausea and vomiting.  Genitourinary: Negative.  Negative for dysuria and urgency.  Skin: Negative.  Negative for rash.  Neurological: Negative for dizziness and headaches.  All other systems reviewed and are negative.   Objective  Alert and oriented x3 in no apparent respiratory distress. Vitals as reported by the patient: Today's Vitals   02/25/20 1143  Weight: 210 lb (95.3 kg)  Height: 6' (1.829 m)    There are no diagnoses linked to this encounter. Hiroyuki was seen today for cough and nasal congestion.  Diagnoses and all orders for this visit:  Rhinosinusitis -     amoxicillin-clavulanate (AUGMENTIN) 875-125 MG tablet; Take 1 tablet by mouth 2 (two) times daily for 7 days.  Acute recurrent maxillary sinusitis     I discussed the assessment and treatment plan with the patient. The patient was provided an  opportunity to ask questions and all were answered. The patient agreed with the plan and demonstrated an understanding of the instructions.   The patient was advised to call back or seek an in-person evaluation if the symptoms worsen or if the condition fails to improve as anticipated.  I provided 20 minutes of non-face-to-face time during this encounter.  Horald Pollen, MD  Primary Care at [facility]

## 2020-02-25 NOTE — Patient Instructions (Signed)
° ° ° °  If you have lab work done today you will be contacted with your lab results within the next 2 weeks.  If you have not heard from us then please contact us. The fastest way to get your results is to register for My Chart. ° ° °IF you received an x-ray today, you will receive an invoice from [facility]. Please contact [facility] at [phone] with questions or concerns regarding your invoice.  ° °IF you received labwork today, you will receive an invoice from [facility]. Please contact [facility] at [phone] with questions or concerns regarding your invoice.  ° °Our billing staff will not be able to assist you with questions regarding bills from these companies. ° °You will be contacted with the lab results as soon as they are available. The fastest way to get your results is to activate your My Chart account. Instructions are located on the last page of this paperwork. If you have not heard from us regarding the results in 2 weeks, please contact this office. °  ° ° ° °

## 2020-03-25 DIAGNOSIS — H11441 Conjunctival cysts, right eye: Secondary | ICD-10-CM | POA: Insufficient documentation

## 2020-03-27 ENCOUNTER — Other Ambulatory Visit: Payer: Self-pay

## 2020-03-27 ENCOUNTER — Telehealth: Payer: Self-pay | Admitting: Family Medicine

## 2020-03-27 DIAGNOSIS — L409 Psoriasis, unspecified: Secondary | ICD-10-CM

## 2020-03-27 MED ORDER — TRIAMCINOLONE ACETONIDE 0.025 % EX CREA: TOPICAL_CREAM | CUTANEOUS | 1 refills | Status: DC

## 2020-03-27 MED ORDER — TRIAMCINOLONE ACETONIDE 0.025 % EX LOTN: TOPICAL_LOTION | CUTANEOUS | 1 refills | Status: DC

## 2020-03-27 NOTE — Telephone Encounter (Signed)
Refill sent.

## 2020-03-27 NOTE — Telephone Encounter (Signed)
What is the name of the medication? Triamcinolone Acetonide 0.025 % LOTN [[id number]] and triamcinolone (KENALOG) 0.025 % cream [[id number]]     Have you contacted your pharmacy to request a refill? Yes, they are not going to fill these scripts. The pharmacy is saying his newest script never came over and the old script they have in the system has expired.   Which pharmacy would you like this sent to? Pharmacy  [facility] [place], [address]  [place], [place] [address]  Phone:  [phone] Fax:  [phone]  DEA #:  [id number]     Patient notified that their request is being sent to the clinical staff for review and that they should receive a call once it is complete. If they do not receive a call within 72 hours they can check with their pharmacy or our office.

## 2020-04-02 ENCOUNTER — Encounter: Payer: Self-pay | Admitting: Family Medicine

## 2020-04-02 ENCOUNTER — Other Ambulatory Visit: Payer: Self-pay

## 2020-04-02 ENCOUNTER — Ambulatory Visit: Payer: No Typology Code available for payment source | Admitting: Family Medicine

## 2020-04-02 VITALS — BP 128/85 | HR 88 | Temp 98.1°F | Resp 16 | Ht 72.0 in | Wt 216.6 lb

## 2020-04-02 DIAGNOSIS — M153 Secondary multiple arthritis: Secondary | ICD-10-CM

## 2020-04-02 NOTE — Progress Notes (Signed)
Patient ID: Walter Evans, male    DOB: 28-Nov-1980  Age: 39 y.o. MRN: [id number]  Chief Complaint  Patient presents with  . handicap parking renewal    pt needs parking pass renewed, no concerns this has been ongoing for several years.     Subjective:   Patient is here for renewal of his handicap sticker.  He has been handicapped for many years and brought the form with him which is partially filled out by the [facility] already.  He has back pain and DJD.  He has had some surgeries.  He is in a wheelchair for about 6 months 20 years ago.  He has a long history of disability.  Walks with a cane.  Current allergies, medications, problem list, past/family and social histories reviewed.  Objective:  BP 128/85   Pulse 88   Temp 98.1 F (36.7 C) (Temporal)   Resp 16   Ht 6' (1.829 m)   Wt 216 lb 9.6 oz (98.2 kg)   SpO2 97%   BMI 29.38 kg/m   Walks with a cane.  Is obviously disabled.  Assessment & Plan:   Assessment: 1. Other secondary osteoarthritis of multiple sites       Plan: See instructions.  Filled out form.  Keep a copy of the form  No orders of the defined types were placed in this encounter.   No orders of the defined types were placed in this encounter.        Patient Instructions    DMV form   If you have lab work done today you will be contacted with your lab results within the next 2 weeks.  If you have not heard from [place] then please contact us. The fastest way to get your results is to register for My Chart.   IF you received an x-ray today, you will receive an invoice from [facility]. Please contact [facility] at [phone] with questions or concerns regarding your invoice.   IF you received labwork today, you will receive an invoice from [facility]. Please contact [facility] at [phone] with questions or concerns regarding your invoice.   Our billing staff will not be able to assist you with questions regarding bills from  these companies.  You will be contacted with the lab results as soon as they are available. The fastest way to get your results is to activate your My Chart account. Instructions are located on the last page of this paperwork. If you have not heard from [place] regarding the results in 2 weeks, please contact this office.        Return if symptoms worsen or fail to improve.   Ruben Reason, MD 04/02/2020

## 2020-04-02 NOTE — Patient Instructions (Addendum)
  DMV form   If you have lab work done today you will be contacted with your lab results within the next 2 weeks.  If you have not heard from [place] then please contact us. The fastest way to get your results is to register for My Chart.   IF you received an x-ray today, you will receive an invoice from [facility]. Please contact [facility] at [phone] with questions or concerns regarding your invoice.   IF you received labwork today, you will receive an invoice from [facility]. Please contact [facility] at [phone] with questions or concerns regarding your invoice.   Our billing staff will not be able to assist you with questions regarding bills from these companies.  You will be contacted with the lab results as soon as they are available. The fastest way to get your results is to activate your My Chart account. Instructions are located on the last page of this paperwork. If you have not heard from [place] regarding the results in 2 weeks, please contact this office.

## 2020-09-21 ENCOUNTER — Other Ambulatory Visit: Payer: Self-pay

## 2020-09-21 ENCOUNTER — Ambulatory Visit: Payer: No Typology Code available for payment source | Admitting: Registered Nurse

## 2020-09-21 ENCOUNTER — Encounter: Payer: Self-pay | Admitting: Registered Nurse

## 2020-09-21 VITALS — BP 130/92 | HR 93 | Temp 98.0°F | Resp 18 | Ht 72.0 in | Wt 225.0 lb

## 2020-09-21 DIAGNOSIS — L409 Psoriasis, unspecified: Secondary | ICD-10-CM | POA: Diagnosis not present

## 2020-09-21 DIAGNOSIS — H547 Unspecified visual loss: Secondary | ICD-10-CM | POA: Diagnosis not present

## 2020-09-21 DIAGNOSIS — Z1211 Encounter for screening for malignant neoplasm of colon: Secondary | ICD-10-CM

## 2020-09-21 DIAGNOSIS — H5789 Other specified disorders of eye and adnexa: Secondary | ICD-10-CM

## 2020-09-21 MED ORDER — TRIAMCINOLONE ACETONIDE 0.025 % EX LOTN: TOPICAL_LOTION | CUTANEOUS | 1 refills | Status: DC

## 2020-09-21 MED ORDER — TRIAMCINOLONE ACETONIDE 0.025 % EX CREA: TOPICAL_CREAM | CUTANEOUS | 1 refills | Status: DC

## 2020-09-21 NOTE — Progress Notes (Signed)
Established Patient Office Visit  Subjective:  Patient ID: Walter Evans, male    DOB: [birth year]  Age: 40 y.o. MRN: [id number]  CC:  Chief Complaint  Patient presents with  . Referral    Patient states he would like a referral for a Colonoscopy because his grandmother and mother both had colon cancer. Also need a medication refill    HPI Walter Evans presents for referral  Strong fam hx of colon ca including mother and maternal grandmother  No acute symptoms at this time.  Does have hx of psoriasis, no other known autoimmune conditions. No red flag symptoms at this time.   Notes foreign body sensation in R eye.  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 Allergy   . Arthritis    RIGHT knee and back  . Back pain    L3-L4  . HSV-2 (herpes simplex virus 2) infection   . MRSA (methicillin resistant Staphylococcus aureus)   . Psoriasis     Past Surgical History:  Procedure Laterality Date  . TONSILLECTOMY  2007  . WISDOM TOOTH EXTRACTION  2003    Family History  Problem Relation Age of Onset  . Hypertension Mother   . Cancer Mother 89       colon cancer  . Heart disease Mother   . Hypertension Father   . Cancer Father        "a benign form of cancer that causes tumors in the ear."  . Hyperlipidemia Father   . Cancer Maternal Grandmother        colon cancer  . Heart disease Maternal Grandmother   . Heart disease Maternal Grandfather   . Cancer Paternal Grandmother        breast cancer    Social History   Socioeconomic History  . Marital status: Married    Spouse name: [name]   . Number of children: 0  . Years of education: 22  . Highest education level: Not on file  Occupational History  . Occupation: Production manager  Tobacco Use  . Smoking status:  Never Smoker  . Smokeless tobacco: Never Used  Vaping Use  . Vaping Use: Never used  Substance and Sexual Activity  . Alcohol use: Yes    Alcohol/week: 0.0 - 2.0 standard drinks  . Drug use: No  . Sexual activity: Yes    Partners: Male, Male  Other Topics Concern  . Not on file  Social History Narrative   Patient lives at home with wife [name].   Patient is ambidextrous    I'm a moderately active vegetarian. But I like [place]. But I drink a lot of water.   1 cup of coffee, 6-7 cups of tea/day.   Social Determinants of Health   Financial Resource Strain: Not on file  Food Insecurity: Not on file  Transportation Needs: Not on file  Physical Activity: Not on file  Stress: Not on file  Social Connections: Not on file  Intimate Partner Violence: Not on file    Outpatient Medications Prior to Visit  Medication Sig Dispense Refill  . triamcinolone (KENALOG) 0.025 % cream Apply sparingly to affected areas. 30 g 1  . Triamcinolone Acetonide 0.025 % LOTN Apply sparingly to affected areas. 60 mL 1   No facility-administered medications prior  to visit.    Allergies  Allergen Reactions  . Shellfish Allergy     unknown    ROS Review of Systems  Constitutional: Negative.   HENT: Negative.   Eyes: Negative.   Respiratory: Negative.   Cardiovascular: Negative.   Gastrointestinal: Negative.   Genitourinary: Negative.   Musculoskeletal: Negative.   Skin: Negative.   Neurological: Negative.   Psychiatric/Behavioral: Negative.   All other systems reviewed and are negative.     Objective:    Physical Exam Constitutional:      General: He is not in acute distress.    Appearance: Normal appearance. He is normal weight. He is not ill-appearing, toxic-appearing or diaphoretic.  HENT:     Head: Normocephalic and atraumatic.  Eyes:     General:        Right eye: No discharge.        Left eye: No discharge.     Extraocular Movements: Extraocular movements intact.      Conjunctiva/sclera: Conjunctivae normal.     Pupils: Pupils are equal, round, and reactive to light.  Cardiovascular:     Rate and Rhythm: Normal rate and regular rhythm.     Heart sounds: Normal heart sounds. No murmur heard. No friction rub. No gallop.   Pulmonary:     Effort: Pulmonary effort is normal. No respiratory distress.     Breath sounds: Normal breath sounds. No stridor. No wheezing, rhonchi or rales.  Chest:     Chest wall: No tenderness.  Neurological:     General: No focal deficit present.     Mental Status: He is alert and oriented to person, place, and time. Mental status is at baseline.  Psychiatric:        Mood and Affect: Mood normal.        Behavior: Behavior normal.        Thought Content: Thought content normal.        Judgment: Judgment normal.     BP (!) 130/92   Pulse 93   Temp 98 F (36.7 C) (Temporal)   Resp 18   Ht 6' (1.829 m)   Wt 225 lb (102.1 kg)   SpO2 98%   BMI 30.52 kg/m  Wt Readings from Last 3 Encounters:  09/21/20 225 lb (102.1 kg)  04/02/20 216 lb 9.6 oz (98.2 kg)  02/25/20 210 lb (95.3 kg)     There are no preventive care reminders to display for this patient.  There are no preventive care reminders to display for this patient.  Lab Results  Component Value Date   TSH 1.050 02/06/2016   Lab Results  Component Value Date   WBC 4.7 08/10/2018   HGB 15.7 08/10/2018   HCT 45.9 08/10/2018   MCV 83 08/10/2018   PLT 282 08/10/2018   Lab Results  Component Value Date   NA 143 10/23/2019   K 4.4 10/23/2019   CO2 23 10/23/2019   GLUCOSE 91 10/23/2019   BUN 9 10/23/2019   CREATININE 0.91 10/23/2019   BILITOT 0.3 10/23/2019   ALKPHOS 113 10/23/2019   AST 21 10/23/2019   ALT 18 10/23/2019   PROT 7.2 10/23/2019   ALBUMIN 5.2 (H) 10/23/2019   CALCIUM 10.2 10/23/2019   Lab Results  Component Value Date   CHOL 279 (H) 10/23/2019   Lab Results  Component Value Date   HDL 51 10/23/2019   Lab Results  Component  Value Date   LDLCALC 153 (H) 10/23/2019   Lab Results  Component Value Date   TRIG 402 (H) 10/23/2019   Lab Results  Component Value Date   CHOLHDL 5.5 (H) 10/23/2019   No results found for: HGBA1C    Assessment & Plan:   Problem List Items Addressed This Visit      Musculoskeletal and Integument   Psoriasis   Relevant Medications   triamcinolone (KENALOG) 0.025 % cream   Triamcinolone Acetonide 0.025 % LOTN    Other Visit Diagnoses    Screening for colon cancer    -  Primary   Relevant Orders   Ambulatory referral to Gastroenterology   Partial blindness       Relevant Orders   Ambulatory referral to Ophthalmology   Eye irritation       Relevant Orders   Ambulatory referral to Ophthalmology      No orders of the defined types were placed in this encounter.   Follow-up: No follow-ups on file.   PLAN  Referrals placed  Discussed signs and symptoms that warrant concern  Refill triamcinolone  Patient encouraged to call clinic with any questions, comments, or concerns.  Maximiano Coss, NP

## 2020-09-21 NOTE — Patient Instructions (Signed)
° ° ° °  If you have lab work done today you will be contacted with your lab results within the next 2 weeks.  If you have not heard from us then please contact us. The fastest way to get your results is to register for My Chart. ° ° °IF you received an x-ray today, you will receive an invoice from [facility]. Please contact [facility] at [phone] with questions or concerns regarding your invoice.  ° °IF you received labwork today, you will receive an invoice from [facility]. Please contact [facility] at [phone] with questions or concerns regarding your invoice.  ° °Our billing staff will not be able to assist you with questions regarding bills from these companies. ° °You will be contacted with the lab results as soon as they are available. The fastest way to get your results is to activate your My Chart account. Instructions are located on the last page of this paperwork. If you have not heard from us regarding the results in 2 weeks, please contact this office. °  ° ° ° °

## 2020-11-03 ENCOUNTER — Encounter: Payer: Self-pay | Admitting: Gastroenterology

## 2020-11-03 ENCOUNTER — Ambulatory Visit: Payer: No Typology Code available for payment source | Admitting: Gastroenterology

## 2020-11-03 VITALS — BP 130/80 | HR 82 | Ht 72.0 in | Wt 218.0 lb

## 2020-11-03 DIAGNOSIS — Z800 Family history of malignant neoplasm of digestive organs: Secondary | ICD-10-CM

## 2020-11-03 DIAGNOSIS — Z1211 Encounter for screening for malignant neoplasm of colon: Secondary | ICD-10-CM | POA: Diagnosis not present

## 2020-11-03 DIAGNOSIS — Z8371 Family history of colonic polyps: Secondary | ICD-10-CM

## 2020-11-03 MED ORDER — NA SULFATE-K SULFATE-MG SULF 17.5-3.13-1.6 GM/177ML PO SOLN: 1.0000 | Freq: Once | ORAL | 0 refills | Status: AC

## 2020-11-03 NOTE — Patient Instructions (Signed)
You have been scheduled for a colonoscopy. Please follow written instructions given to you at your visit today.  Please pick up your prep supplies at the pharmacy within the next 1-3 days. If you use inhalers (even only as needed), please bring them with you on the day of your procedure.   Normal BMI (Body Mass Index- based on height and weight) is between 19 and 25. Your BMI today is Body mass index is 29.57 kg/m. Marland Kitchen Please consider follow up  regarding your BMI with your Primary Care Provider.  Thank you for choosing me and [facility].  Pricilla Riffle. Dagoberto Ligas., MD., Marval Regal

## 2020-11-03 NOTE — Progress Notes (Signed)
History of Present Illness: This is a 40 year old male referred by Wendie Agreste, MD for the evaluation of strong family history of colon cancer and colon polyps.  He relates that 3 grandparents had colon cancer.  His mother and father have both had colon polyps.  His mother underwent a right hemicolectomy for a large precancerous polyp.  He does not have any living siblings.  He is a vegetarian due to the fact that he cannot tolerate meats which leads to diarrhea.  Several other foods he avoids that lead to diarrhea.  No ongoing gastrointestinal complaints.  No prior colonoscopy.  Denies weight loss, abdominal pain, constipation, diarrhea, change in stool caliber, melena, hematochezia, nausea, vomiting, dysphagia, reflux symptoms, chest pain.    Allergies  Allergen Reactions  . Shellfish Allergy     unknown   Outpatient Medications Prior to Visit  Medication Sig Dispense Refill  . triamcinolone (KENALOG) 0.025 % cream Apply sparingly to affected areas. 30 g 1  . Triamcinolone Acetonide 0.025 % LOTN Apply sparingly to affected areas. 60 mL 1   No facility-administered medications prior to visit.   Past Medical History:  Diagnosis Date  . Allergy   . Arthritis    RIGHT knee and back  . Back pain    L3-L4  . HSV-2 (herpes simplex virus 2) infection   . MRSA (methicillin resistant Staphylococcus aureus)   . Psoriasis    Past Surgical History:  Procedure Laterality Date  . TONSILLECTOMY  2007  . WISDOM TOOTH EXTRACTION  2003   Social History   Socioeconomic History  . Marital status: Married    Spouse name: [name]   . Number of children: 0  . Years of education: 58  . Highest education level: Not on file  Occupational History  . Occupation: Production manager  Tobacco Use  . Smoking status: Never Smoker  . Smokeless tobacco: Never Used  Vaping Use  . Vaping Use: Never used  Substance and Sexual Activity  . Alcohol use: Yes    Alcohol/week: 0.0 - 2.0 standard  drinks  . Drug use: No  . Sexual activity: Yes    Partners: Male, Male  Other Topics Concern  . Not on file  Social History Narrative   Patient lives at home with wife [name].   Patient is ambidextrous    I'm a moderately active vegetarian. But I like [place]. But I drink a lot of water.   1 cup of coffee, 6-7 cups of tea/day.   Social Determinants of Health   Financial Resource Strain: Not on file  Food Insecurity: Not on file  Transportation Needs: Not on file  Physical Activity: Not on file  Stress: Not on file  Social Connections: Not on file   Family History  Problem Relation Age of Onset  . Hypertension Mother   . Cancer Mother 59       colon cancer  . Heart disease Mother   . Colon polyps Mother   . Hypertension Father   . Cancer Father        "a benign form of cancer that causes tumors in the ear."  . Hyperlipidemia Father   . Colon polyps Father   . Cancer Maternal Grandmother        colon cancer  . Heart disease Maternal Grandmother   . Colon cancer Maternal Grandmother   . Heart disease Maternal Grandfather   . Colon cancer Maternal Grandfather   . Cancer Paternal Grandmother  breast cancer  . Colon cancer Paternal Grandmother       Review of Systems: Pertinent positive and negative review of systems were noted in the above HPI section. All other review of systems were otherwise negative.   Physical Exam: General: Well developed, well nourished, no acute distress Head: Normocephalic and atraumatic Eyes: Sclerae anicteric, EOMI Ears: Normal auditory acuity Mouth: Not examined, mask on during Covid-19 pandemic Neck: Supple, no masses or thyromegaly Lungs: Clear throughout to auscultation Heart: Regular rate and rhythm; no murmurs, rubs or bruits Abdomen: Soft, non tender and non distended. No masses, hepatosplenomegaly or hernias noted. Normal Bowel sounds Rectal: Deferred to colonoscopy Musculoskeletal: Symmetrical with no gross  deformities  Skin: No lesions on visible extremities Pulses:  Normal pulses noted Extremities: No clubbing, cyanosis, edema or deformities noted Neurological: Alert oriented x 4, grossly nonfocal Cervical Nodes:  No significant cervical adenopathy Inguinal Nodes: No significant inguinal adenopathy Psychological:  Alert and cooperative. Normal mood and affect   Assessment and Recommendations:  1.  Strong family history of colon cancer, 3 grandparents.  Strong family history of colon polyps, both mother and father.  Schedule colonoscopy. The risks (including bleeding, perforation, infection, missed lesions, medication reactions and possible hospitalization or surgery if complications occur), benefits, and alternatives to colonoscopy with possible biopsy and possible polypectomy were discussed with the patient and they consent to proceed.     cc: Wendie Agreste, MD [address] [place],  [place] [address]

## 2020-11-04 ENCOUNTER — Telehealth: Payer: No Typology Code available for payment source | Admitting: Family Medicine

## 2020-11-04 ENCOUNTER — Other Ambulatory Visit: Payer: Self-pay

## 2020-11-04 ENCOUNTER — Encounter: Payer: Self-pay | Admitting: Family Medicine

## 2020-11-04 VITALS — BP 130/80 | Wt 208.0 lb

## 2020-11-04 DIAGNOSIS — J019 Acute sinusitis, unspecified: Secondary | ICD-10-CM | POA: Diagnosis not present

## 2020-11-04 MED ORDER — AMOXICILLIN-POT CLAVULANATE 875-125 MG PO TABS: 1.0000 | ORAL_TABLET | Freq: Two times a day (BID) | ORAL | 0 refills | Status: DC

## 2020-11-04 NOTE — Progress Notes (Signed)
Virtual Visit via Telephone Note  I connected with Walter Evans on 11/04/20 at 4:37 PM by telephone and verified that I am speaking with the correct person using two identifiers. Patient location:home My location: office   I discussed the limitations, risks, security and privacy concerns of performing an evaluation and management service by telephone and the availability of in person appointments. I also discussed with the patient that there may be a patient responsible charge related to this service. The patient expressed understanding and agreed to proceed, consent obtained.   Chief complaint: Chief Complaint  Patient presents with  . Sinus Problem    Patient states he had an head cold over a week that he thought was covid but tested negative. Patient is experiencing nasal drip, and a lot of coughing that is lingering and feels like its now a sinus infection. He is currently taking Mucinex it has helped with the sinus pressure.      History of Present Illness:  Cold symptoms 2 weeks ago - multiple family members with same. Negative covid test. Persistent postnasal drip, worsening nasal congestion. Other sx's such as fever (99), bodyache, cough, sweats have improved (only lasted 2-3 days). Discolored nasal d/c past few days and same with cough in am. Min cough during during the day.  Some ear pressure.  Tx: mucinex D - some relief. No nasal sprays - not helpful in past and trouble tolerating.  No face/tooth pain.  No recent fever, no dyspnea.  Eating and drinking ok.  No hx of allergic rhinitis.    Patient Active Problem List   Diagnosis Date Noted  . Immune to hepatitis B 02/11/2016  . LBP radiating to right leg 02/06/2016  . BMI 28.0-28.9,adult 02/06/2016  . Memory loss 05/08/2014  . Cognitive decline 01/01/2014  . Psoriasis 11/29/2013  . Sinusitis, chronic 01/03/2012  . 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 Recurrent genital HSV (herpes simplex virus)  infection 11/09/2011   Past Medical History:  Diagnosis Date  . Allergy   . Arthritis    RIGHT knee and back  . Back pain    L3-L4  . HSV-2 (herpes simplex virus 2) infection   . MRSA (methicillin resistant Staphylococcus aureus)   . Psoriasis    Past Surgical History:  Procedure Laterality Date  . TONSILLECTOMY  2007  . WISDOM TOOTH EXTRACTION  2003   Allergies  Allergen Reactions  . Shellfish Allergy     unknown   Prior to Admission medications   Medication Sig Start Date End Date Taking? Authorizing Provider  triamcinolone (KENALOG) 0.025 % cream Apply sparingly to affected areas. 09/21/20  Yes Maximiano Coss, NP  Triamcinolone Acetonide 0.025 % LOTN Apply sparingly to affected areas. 09/21/20  Yes Maximiano Coss, NP   Social History   Socioeconomic History  . Marital status: Divorced    Spouse name: [name]   . Number of children: 0  . Years of education: 47  . Highest education level: Not on file  Occupational History  . Occupation: Production manager  Tobacco Use  . Smoking status: Never Smoker  . Smokeless tobacco: Never Used  Vaping Use  . Vaping Use: Never used  Substance and Sexual Activity  . Alcohol use: Yes    Alcohol/week: 0.0 - 2.0 standard drinks  . Drug use: No  . Sexual activity: Yes    Partners: Male, Male  Other Topics Concern  . Not on file  Social History Narrative  Patient lives at home with wife [name].   Patient is ambidextrous    I'm a moderately active vegetarian. But I like [place]. But I drink a lot of water.   1 cup of coffee, 6-7 cups of tea/day.   Social Determinants of Health   Financial Resource Strain: Not on file  Food Insecurity: Not on file  Transportation Needs: Not on file  Physical Activity: Not on file  Stress: Not on file  Social Connections: Not on file  Intimate Partner Violence: Not on file     Observations/Objective: Vitals:   11/04/20 1505  BP: 130/80  Weight: 208 lb (94.3 kg)  temp  97.  Speaking in full sentences, no respiratory distress, appropriate responses. All questions answered, understanding of plan expressed.   Assessment and Plan: Acute sinusitis, recurrence not specified, unspecified location - Plan: amoxicillin-clavulanate (AUGMENTIN) 875-125 MG tablet  -Persistent nasal discharge after initial viral URI, possible secondary bacterial sinusitis, start Augmentin, potential side effects and risks of antibiotics were discussed.  Continue symptomatic care with Mucinex and RTC precautions given  Follow Up Instructions: As needed   I discussed the assessment and treatment plan with the patient. The patient was provided an opportunity to ask questions and all were answered. The patient agreed with the plan and demonstrated an understanding of the instructions.   The patient was advised to call back or seek an in-person evaluation if the symptoms worsen or if the condition fails to improve as anticipated.  I provided 12 minutes of non-face-to-face time during this encounter.  Signed,   Merri Ray, MD [facility].  11/04/20

## 2020-11-04 NOTE — Patient Instructions (Signed)
° ° ° °  If you have lab work done today you will be contacted with your lab results within the next 2 weeks.  If you have not heard from us then please contact us. The fastest way to get your results is to register for My Chart. ° ° °IF you received an x-ray today, you will receive an invoice from [facility]. Please contact [facility] at [phone] with questions or concerns regarding your invoice.  ° °IF you received labwork today, you will receive an invoice from [facility]. Please contact [facility] at [phone] with questions or concerns regarding your invoice.  ° °Our billing staff will not be able to assist you with questions regarding bills from these companies. ° °You will be contacted with the lab results as soon as they are available. The fastest way to get your results is to activate your My Chart account. Instructions are located on the last page of this paperwork. If you have not heard from us regarding the results in 2 weeks, please contact this office. °  ° ° ° °

## 2021-01-04 ENCOUNTER — Encounter: Payer: Self-pay | Admitting: Gastroenterology

## 2021-01-13 ENCOUNTER — Other Ambulatory Visit: Payer: Self-pay

## 2021-01-13 ENCOUNTER — Encounter: Payer: Self-pay | Admitting: Gastroenterology

## 2021-01-13 ENCOUNTER — Ambulatory Visit (AMBULATORY_SURGERY_CENTER): Payer: No Typology Code available for payment source | Admitting: Gastroenterology

## 2021-01-13 VITALS — BP 113/73 | HR 76 | Temp 98.4°F | Resp 10 | Ht 73.0 in | Wt 218.0 lb

## 2021-01-13 DIAGNOSIS — Z800 Family history of malignant neoplasm of digestive organs: Secondary | ICD-10-CM

## 2021-01-13 DIAGNOSIS — Z8371 Family history of colonic polyps: Secondary | ICD-10-CM | POA: Diagnosis not present

## 2021-01-13 DIAGNOSIS — K635 Polyp of colon: Secondary | ICD-10-CM

## 2021-01-13 DIAGNOSIS — Z1211 Encounter for screening for malignant neoplasm of colon: Secondary | ICD-10-CM

## 2021-01-13 DIAGNOSIS — D125 Benign neoplasm of sigmoid colon: Secondary | ICD-10-CM

## 2021-01-13 DIAGNOSIS — Z83719 Family history of colon polyps, unspecified: Secondary | ICD-10-CM

## 2021-01-13 DIAGNOSIS — D123 Benign neoplasm of transverse colon: Secondary | ICD-10-CM

## 2021-01-13 MED ORDER — SODIUM CHLORIDE 0.9 % IV SOLN: 500.0000 mL | INTRAVENOUS | Status: AC

## 2021-01-13 NOTE — Patient Instructions (Signed)
YOU HAD AN ENDOSCOPIC PROCEDURE TODAY AT THE [facility]:   Refer to the procedure report that was given to you for any specific questions about what was found during the examination.  If the procedure report does not answer your questions, please call your gastroenterologist to clarify.  If you requested that your care partner not be given the details of your procedure findings, then the procedure report has been included in a sealed envelope for you to review at your convenience later.  YOU SHOULD EXPECT: Some feelings of bloating in the abdomen. Passage of more gas than usual.  Walking can help get rid of the air that was put into your GI tract during the procedure and reduce the bloating. If you had a lower endoscopy (such as a colonoscopy or flexible sigmoidoscopy) you may notice spotting of blood in your stool or on the toilet paper. If you underwent a bowel prep for your procedure, you may not have a normal bowel movement for a few days.  Please Note:  You might notice some irritation and congestion in your nose or some drainage.  This is from the oxygen used during your procedure.  There is no need for concern and it should clear up in a day or so.  SYMPTOMS TO REPORT IMMEDIATELY:  Following lower endoscopy (colonoscopy or flexible sigmoidoscopy):  Excessive amounts of blood in the stool  Significant tenderness or worsening of abdominal pains  Swelling of the abdomen that is new, acute  Fever of 100F or higher   For urgent or emergent issues, a gastroenterologist can be reached at any hour by calling [phone]. Do not use MyChart messaging for urgent concerns.    DIET:  We do recommend a small meal at first, but then you may proceed to your regular diet.  Drink plenty of fluids but you should avoid alcoholic beverages for 24 hours.  MEDICATIONS:  Continue present medications.  Please see handouts given to you by your recovery nurse.  Thank you for allowing us to  provide for your healthcare needs today.  ACTIVITY:  You should plan to take it easy for the rest of today and you should NOT DRIVE or use heavy machinery until tomorrow (because of the sedation medicines used during the test).    FOLLOW UP: Our staff will call the number listed on your records 48-72 hours following your procedure to check on you and address any questions or concerns that you may have regarding the information given to you following your procedure. If we do not reach you, we will leave a message.  We will attempt to reach you two times.  During this call, we will ask if you have developed any symptoms of COVID 19. If you develop any symptoms (ie: fever, flu-like symptoms, shortness of breath, cough etc.) before then, please call [phone].  If you test positive for Covid 19 in the 2 weeks post procedure, please call and report this information to us.    If any biopsies were taken you will be contacted by phone or by letter within the next 1-3 weeks.  Please call us at [phone] if you have not heard about the biopsies in 3 weeks.    SIGNATURES/CONFIDENTIALITY: You and/or your care partner have signed paperwork which will be entered into your electronic medical record.  These signatures attest to the fact that that the information above on your After Visit Summary has been reviewed and is understood.  Full responsibility of the   confidentiality of this discharge information lies with you and/or your care-partner.  

## 2021-01-13 NOTE — Progress Notes (Signed)
Report to PACU, RN, vss, BBS= Clear.  

## 2021-01-13 NOTE — Progress Notes (Signed)
Called to room to assist during endoscopic procedure.  Patient ID and intended procedure confirmed with present staff. Received instructions for my participation in the procedure from the performing physician.  

## 2021-01-13 NOTE — Op Note (Signed)
[facility] Patient Name: Walter Evans Procedure Date: 01/13/2021 1:55 PM MRN: [id number] Endoscopist: Ladene Artist , MD Age: 40 Referring MD:  Date of Birth: [birth year] Gender: Male Account #: [id number] Procedure:                Colonoscopy Indications:              Colon cancer screening in patient at increased                            risk: Family history of colon polyps in multiple                            1st-degree relatives, Family history of colorectal                            cancer in multiple 2nd degree relatives Medicines:                Monitored Anesthesia Care Procedure:                Pre-Anesthesia Assessment:                           - Prior to the procedure, a History and Physical                            was performed, and patient medications and                            allergies were reviewed. The patient's tolerance of                            previous anesthesia was also reviewed. The risks                            and benefits of the procedure and the sedation                            options and risks were discussed with the patient.                            All questions were answered, and informed consent                            was obtained. Prior Anticoagulants: The patient has                            taken no previous anticoagulant or antiplatelet                            agents. ASA Grade Assessment: III - A patient with                            severe systemic disease. After reviewing the risks  and benefits, the patient was deemed in                            satisfactory condition to undergo the procedure.                           After obtaining informed consent, the colonoscope                            was passed under direct vision. Throughout the                            procedure, the patient's blood pressure, pulse, and                            oxygen saturations were  monitored continuously. The                            CF [id number] #[id number] was introduced through the anus                            and advanced to the the cecum, identified by                            appendiceal orifice and ileocecal valve. The                            ileocecal valve, appendiceal orifice, and rectum                            were photographed. The quality of the bowel                            preparation was adequate after extensive lavage and                            suction. The colonoscopy was performed without                            difficulty. The patient tolerated the procedure                            well. Scope In: 2:05:14 PM Scope Out: 2:23:52 PM Scope Withdrawal Time: 0 hours 15 minutes 54 seconds  Total Procedure Duration: 0 hours 18 minutes 38 seconds  Findings:                 The perianal and digital rectal examinations were                            normal.                           Two sessile polyps were found in the sigmoid colon  and transverse colon. The polyps were 7 to 8 mm in                            size. These polyps were removed with a cold snare.                            Resection and retrieval were complete.                           A diffuse area of severe melanosis was found in the                            entire colon.                           The exam was otherwise without abnormality on                            direct and retroflexion views. Complications:            No immediate complications. Estimated blood loss:                            None. Estimated Blood Loss:     Estimated blood loss: none. Impression:               - Two 7 to 8 mm polyps in the sigmoid colon and in                            the transverse colon, removed with a cold snare.                            Resected and retrieved.                           - Melanosis in the colon.                           -  The examination was otherwise normal on direct                            and retroflexion views. Recommendation:           - Repeat colonoscopy in 3 - 5 years for                            surveillance based on pathology results with a more                            extensive bowel prep.                           - Patient has a contact number available for  emergencies. The signs and symptoms of potential                            delayed complications were discussed with the                            patient. Return to normal activities tomorrow.                            Written discharge instructions were provided to the                            patient.                           - Resume previous diet.                           - Continue present medications.                           - Await pathology results. Ladene Artist, MD 01/13/2021 2:28:54 PM This report has been signed electronically.

## 2021-01-13 NOTE — Progress Notes (Signed)
Vs VV 

## 2021-01-15 ENCOUNTER — Telehealth: Payer: Self-pay

## 2021-01-15 NOTE — Telephone Encounter (Signed)
  Follow up Call-  Call back number 01/13/2021  Post procedure Call Back phone  # [phone]  Permission to leave phone message Yes  Some recent data might be hidden     Patient questions:  Do you have a fever, pain , or abdominal swelling? No. Pain Score  0 *  Have you tolerated food without any problems? Yes.    Have you been able to return to your normal activities? Yes.    Do you have any questions about your discharge instructions: Diet   No. Medications  No. Follow up visit  No.  Do you have questions or concerns about your Care? No.  Actions: * If pain score is 4 or above: No action needed, pain <4.

## 2021-01-25 ENCOUNTER — Encounter: Payer: Self-pay | Admitting: Gastroenterology

## 2021-04-01 ENCOUNTER — Ambulatory Visit: Payer: No Typology Code available for payment source

## 2021-04-01 ENCOUNTER — Other Ambulatory Visit: Payer: Self-pay

## 2021-04-01 DIAGNOSIS — Z23 Encounter for immunization: Secondary | ICD-10-CM

## 2021-04-14 ENCOUNTER — Ambulatory Visit: Payer: No Typology Code available for payment source | Attending: Internal Medicine

## 2021-04-14 ENCOUNTER — Other Ambulatory Visit (HOSPITAL_BASED_OUTPATIENT_CLINIC_OR_DEPARTMENT_OTHER): Payer: Self-pay

## 2021-04-14 DIAGNOSIS — Z23 Encounter for immunization: Secondary | ICD-10-CM

## 2021-04-14 MED ORDER — PFIZER COVID-19 VAC BIVALENT 30 MCG/0.3ML IM SUSP
INTRAMUSCULAR | 0 refills | Status: DC
  Filled 2021-04-14: qty 0.3, 1d supply, fill #0

## 2021-04-14 NOTE — Progress Notes (Signed)
   Covid-19 Vaccination Clinic  Name:  Walter Evans    MRN: [id number] DOB: [birth year]  04/14/2021  [name] was observed post Covid-19 immunization for 15 minutes without incident. He was provided with Vaccine Information Sheet and instruction to access the V-Safe system.   [name] was instructed to call 911 with any severe reactions post vaccine: Difficulty breathing  Swelling of face and throat  A fast heartbeat  A bad rash all over body  Dizziness and weakness

## 2021-05-19 ENCOUNTER — Ambulatory Visit: Payer: No Typology Code available for payment source | Admitting: Family Medicine

## 2021-05-19 ENCOUNTER — Encounter: Payer: Self-pay | Admitting: Family Medicine

## 2021-05-19 VITALS — BP 118/74 | HR 84 | Temp 98.2°F | Resp 15 | Ht 73.0 in | Wt 217.6 lb

## 2021-05-19 DIAGNOSIS — J019 Acute sinusitis, unspecified: Secondary | ICD-10-CM

## 2021-05-19 DIAGNOSIS — R051 Acute cough: Secondary | ICD-10-CM | POA: Diagnosis not present

## 2021-05-19 MED ORDER — BENZONATATE 100 MG PO CAPS: 100.0000 mg | ORAL_CAPSULE | Freq: Three times a day (TID) | ORAL | 0 refills | Status: DC | PRN

## 2021-05-19 MED ORDER — AMOXICILLIN-POT CLAVULANATE 875-125 MG PO TABS: 1.0000 | ORAL_TABLET | Freq: Two times a day (BID) | ORAL | 0 refills | Status: DC

## 2021-05-19 MED ORDER — PROMETHAZINE-DM 6.25-15 MG/5ML PO SYRP: 2.5000 mL | ORAL_SOLUTION | Freq: Every evening | ORAL | 0 refills | Status: DC | PRN

## 2021-05-19 NOTE — Progress Notes (Signed)
Subjective:  Patient ID: Walter Evans, male    DOB: Jul 16, 1980  Age: 40 y.o. MRN: [id number]  CC:  Chief Complaint  Patient presents with   Cough    Pt reports postnasal drip, sore throat, cough with production, denies fever has been trying to take dayquil to help with sxs. Started Thursday     HPI Walter Evans presents for   Sore throat/cough Started about days ago. No measured fever.  Attempted treatments of DayQuil - min relief at night, some help during day. Mucinex D working well. Cough keeps up at night.  Some postnasal drip with the sore throat, slight productive cough. Some chest congestion in am. No wheeze. Some productive mucus, unsure if discolored.  Sinus pressure, no HA, no face or tooth pain.  No relief with claritin.   Treated for sinusitis in April with Augmentin.  2 weeks of symptoms at that time. No dyspnea, chest pains. Drinking fluids.  Negative covid test 3 days ago (4 days into symptoms), no sick contacts.   COVID-vaccine booster October 5th.  Flu vaccine 9/22 with Tdap at that time.  COVID risk of complication score of 1.   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 Authorizing Provider  triamcinolone (KENALOG) 0.025 % cream Apply sparingly to affected areas. 09/21/20  Yes Maximiano Coss, NP  Triamcinolone Acetonide 0.025 % LOTN Apply sparingly to affected areas. 09/21/20  Yes Maximiano Coss, NP   Social History   Socioeconomic History   Marital status: Divorced    Spouse name: Carissa    Number of children: 0   Years of education: 12   Highest education level: Not on file  Occupational History   Occupation: Production manager  Tobacco Use   Smoking status: Never   Smokeless tobacco: Never  Vaping Use   Vaping Use: Never used  Substance and Sexual Activity   Alcohol use: Yes    Alcohol/week: 0.0 - 2.0 standard drinks   Drug use: Never   Sexual activity: Yes    Partners: Male, Male  Other Topics Concern   Not on file  Social History Narrative   Patient lives at home with wife [name].   Patient is ambidextrous    I'm a moderately active vegetarian. But I like [place]. But I drink a lot of water.   1 cup of coffee, 6-7 cups of tea/day.   Social Determinants of Health   Financial Resource Strain: Not on file  Food Insecurity: Not on file  Transportation Needs: Not on file  Physical Activity: Not on file  Stress: Not on file  Social Connections: Not on file  Intimate Partner Violence: Not on file    Review of Systems Per HPI.   Objective:   Vitals:   05/19/21 0854  BP: 118/74  Pulse: 84  Resp: 15  Temp: 98.2 F (36.8 C)  TempSrc: Temporal  SpO2: 98%  Weight: 217 lb 9.6 oz (98.7 kg)  Height: 6\' 1"  (1.854 m)     Physical Exam Vitals reviewed.  Constitutional:      Appearance: He is well-developed.  HENT:     Head: Normocephalic and atraumatic.     Right Ear: Tympanic membrane, ear canal and external ear normal.     Left Ear: Tympanic membrane, ear canal and external ear normal.      Nose: No rhinorrhea.     Comments: Right frontal, right maxillary sinus tender to percussion.  No neck lymphadenopathy.  No stridor.    Mouth/Throat:     Pharynx: No oropharyngeal exudate or posterior oropharyngeal erythema.  Eyes:     Conjunctiva/sclera: Conjunctivae normal.     Pupils: Pupils are equal, round, and reactive to light.  Cardiovascular:     Rate and Rhythm: Normal rate and regular rhythm.     Heart sounds: Normal heart sounds. No murmur heard. Pulmonary:     Effort: Pulmonary effort is normal.     Breath sounds: Normal breath sounds. No wheezing, rhonchi or rales.  Abdominal:     Palpations: Abdomen is soft.     Tenderness: There is no abdominal tenderness.  Musculoskeletal:     Cervical back: Neck supple.  Lymphadenopathy:     Cervical: No cervical adenopathy.  Skin:    General: Skin is warm and dry.     Findings: No rash.  Neurological:     Mental Status: He is alert and oriented to person, place, and time.  Psychiatric:        Behavior: Behavior normal.       Assessment & Plan:  Walter Evans is a 40 y.o. male . Acute sinusitis, recurrence not specified, unspecified location - Plan: amoxicillin-clavulanate (AUGMENTIN) 875-125 MG tablet  Acute cough - Plan: benzonatate (TESSALON) 100 MG capsule, promethazine-dextromethorphan (PROMETHAZINE-DM) 6.25-15 MG/5ML syrup  Suspected viral illness initially with cough, reassuring exam.  Does have some sinus pressure, previous history of sinusitis, early sinusitis possible but again likely viral at this time.  Symptomatic care discussed with continued Mucinex, saline nasal spray, fluids.  Tessalon Perles if needed for cough, Phenergan DM cough syrup if needed.  Augmentin prescribed for sinusitis, option to delay for the next few days to see if he improves on his own.  Potential side effects and risks of meds discussed.  RTC precautions.   Meds ordered this encounter  Medications   benzonatate (TESSALON) 100 MG  capsule    Sig: Take 1 capsule (100 mg total) by mouth 3 (three) times daily as needed for cough.    Dispense:  20 capsule    Refill:  0   promethazine-dextromethorphan (PROMETHAZINE-DM) 6.25-15 MG/5ML syrup    Sig: Take 2.5-5 mLs by mouth at bedtime as needed for cough.    Dispense:  118 mL    Refill:  0   amoxicillin-clavulanate (AUGMENTIN) 875-125 MG tablet    Sig: Take 1 tablet by mouth 2 (two) times daily.    Dispense:  20 tablet    Refill:  0  Patient Instructions  Continue Mucinex or Mucinex DM for cough, fluids throughout the day.  Saline nasal spray or Nettie pot may also be helpful with sinus congestion.  Tessalon Perles up to 3 times per day as needed for cough, can try that tonight to see if that helps sleep.  If not, Phenergan DM syrup is a little bit stronger and should help cough and allow you to sleep.  However that can be sedating so use lowest effective dose so you do not feel too sedated the next morning.  Augmentin was prescribed for possible early sinusitis.  Okay to wait a day or 2 to see if symptoms are improving and you may not need the antibiotic.  Let me know if there are questions and I hope you feel better soon!    Signed,   Merri Ray, MD [place] 05/19/21 9:43 AM

## 2021-05-19 NOTE — Patient Instructions (Signed)
Continue Mucinex or Mucinex DM for cough, fluids throughout the day.  Saline nasal spray or Nettie pot may also be helpful with sinus congestion.  Tessalon Perles up to 3 times per day as needed for cough, can try that tonight to see if that helps sleep.  If not, Phenergan DM syrup is a little bit stronger and should help cough and allow you to sleep.  However that can be sedating so use lowest effective dose so you do not feel too sedated the next morning.  Augmentin was prescribed for possible early sinusitis.  Okay to wait a day or 2 to see if symptoms are improving and you may not need the antibiotic.  Let me know if there are questions and I hope you feel better soon!

## 2021-10-14 ENCOUNTER — Encounter: Payer: Self-pay | Admitting: Family Medicine

## 2021-10-14 ENCOUNTER — Other Ambulatory Visit (HOSPITAL_BASED_OUTPATIENT_CLINIC_OR_DEPARTMENT_OTHER): Payer: Self-pay

## 2021-10-14 ENCOUNTER — Other Ambulatory Visit: Payer: Self-pay

## 2021-10-14 ENCOUNTER — Ambulatory Visit: Payer: No Typology Code available for payment source | Admitting: Family Medicine

## 2021-10-14 VITALS — BP 130/86 | HR 80 | Temp 98.1°F | Resp 17 | Ht 73.0 in | Wt 223.8 lb

## 2021-10-14 DIAGNOSIS — A6000 Herpesviral infection of urogenital system, unspecified: Secondary | ICD-10-CM

## 2021-10-14 DIAGNOSIS — E785 Hyperlipidemia, unspecified: Secondary | ICD-10-CM | POA: Diagnosis not present

## 2021-10-14 DIAGNOSIS — L409 Psoriasis, unspecified: Secondary | ICD-10-CM

## 2021-10-14 MED ORDER — TRIAMCINOLONE ACETONIDE 0.025 % EX CREA
TOPICAL_CREAM | CUTANEOUS | 2 refills | Status: DC
  Filled 2021-10-14: qty 30, 14d supply, fill #0
  Filled 2022-01-22: qty 30, 14d supply, fill #1
  Filled 2022-04-28: qty 30, 14d supply, fill #2

## 2021-10-14 MED ORDER — TRIAMCINOLONE ACETONIDE 0.025 % EX LOTN
TOPICAL_LOTION | CUTANEOUS | 2 refills | Status: DC
  Filled 2021-10-14: qty 60, 14d supply, fill #0
  Filled 2022-01-22: qty 60, 14d supply, fill #1
  Filled 2022-04-28: qty 60, 14d supply, fill #2

## 2021-10-14 NOTE — Progress Notes (Addendum)
Subjective:  Patient ID: Walter Evans, male    DOB: [birth year]  Age: 41 y.o. MRN: [id number]  CC:  Chief Complaint  Patient presents with   Medication Refill    Patient states he is here for a medication refill. Patient has no other concern.    HPI Walter Evans presents for   Psoriasis Treated previously with triamcinolone 0.025% lotion or cream.  Affects eyebrows, hairline prior with stable control with use of cream. Intermittent dosing - 1-2 times per week working well.  Cream for bare skin, lotion for hair/scalp. Selenium shampoo.   History of recurrent genital HSV. Valtrex 500 mg daily for prevention previously, then switch to as needed dose only due to cost.  Changed to acyclovir 400 mg twice daily for suppression with option of extension to 800 mg twice daily for 5 days for flare. Buying valtrex through [employer] - using as needed dosing of '1000mg'$  QD for 5 days. Feels a little funny on meds. Flairs monthly- when back pain flares.  '500mg'$  daily - no change in frequency.  '1000mg'$  caused dizziness after a few weeks.  Hyperlipidemia: No current meds.  Discussed diet/exercise approach when last checked in April 2021.  Albumin level was also borderline elevated at that time with plan for repeat testing. Albumin 5.2.  Not fasting today. Will return for labs.   Lab Results  Component Value Date   CHOL 279 (H) 10/23/2019   HDL 51 10/23/2019   LDLCALC 153 (H) 10/23/2019   TRIG 402 (H) 10/23/2019   CHOLHDL 5.5 (H) 10/23/2019   Lab Results  Component Value Date   ALT 18 10/23/2019   AST 21 10/23/2019   ALKPHOS 113 10/23/2019   BILITOT 0.3 10/23/2019     History Patient Active Problem List   Diagnosis Date Noted   Immune to hepatitis B 02/11/2016   LBP radiating to right leg 02/06/2016   BMI 28.0-28.9,adult 02/06/2016   Memory loss 05/08/2014   Cognitive decline 01/01/2014   Psoriasis 11/29/2013   Sinusitis, chronic 01/03/2012   Chronic knee pain- right (due  to injury) 01/03/2012   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 Authorizing Provider  triamcinolone (KENALOG) 0.025 % cream Apply sparingly to affected areas. 09/21/20  Yes Maximiano Coss, NP  Triamcinolone Acetonide 0.025 % LOTN Apply sparingly to affected areas. 09/21/20  Yes Maximiano Coss, NP  amoxicillin-clavulanate (AUGMENTIN) 875-125 MG tablet Take 1 tablet by mouth 2 (two) times daily. 05/19/21   Wendie Agreste, MD  benzonatate (TESSALON) 100 MG capsule Take 1 capsule (100 mg total) by mouth 3 (three) times daily as needed for cough. 05/19/21   Wendie Agreste, MD  promethazine-dextromethorphan (PROMETHAZINE-DM) 6.25-15 MG/5ML syrup Take 2.5-5 mLs by mouth at bedtime as needed for cough. 05/19/21   Wendie Agreste, MD   Social  History   Socioeconomic History   Marital status: Divorced    Spouse name: Carissa    Number of children: 0   Years of education: 12   Highest education level: Not on file  Occupational History   Occupation: Production manager  Tobacco Use   Smoking status: Never   Smokeless tobacco: Never  Vaping Use   Vaping Use: Never used  Substance and Sexual Activity   Alcohol use: Yes    Alcohol/week: 0.0 - 2.0 standard drinks   Drug use: Never   Sexual activity: Yes    Partners: Male, Male  Other Topics Concern   Not on file  Social History Narrative   Patient lives at home with  wife [name].   Patient is ambidextrous    I'm a moderately active vegetarian. But I like [place]. But I drink a lot of water.   1 cup of coffee, 6-7 cups of tea/day.   Social Determinants of Health   Financial Resource Strain: Not on file  Food Insecurity: Not on file  Transportation Needs: Not on file  Physical Activity: Not on file  Stress: Not on file  Social Connections: Not on file  Intimate Partner Violence: Not on file    Review of Systems  Respiratory:  Negative for cough, chest tightness and shortness of breath.   Cardiovascular:  Negative for chest pain, palpitations and leg swelling.  Skin:  Positive for rash (scalp.).  Neurological:  Negative for dizziness, light-headedness and headaches.    Objective:   Vitals:   10/14/21 0903  BP: 130/86  Pulse: 80  Resp: 17  Temp: 98.1 F (36.7 C)  TempSrc: Temporal  SpO2: 99%  Weight: 223 lb 12.8 oz (101.5 kg)  Height: '6\' 1"'$  (1.854 m)     Physical Exam Vitals reviewed.  Constitutional:      Appearance: He is well-developed.  HENT:     Head: Normocephalic and atraumatic.  Neck:     Vascular: No carotid bruit or JVD.  Cardiovascular:     Rate and Rhythm: Normal rate and regular rhythm.     Heart sounds: Normal heart sounds. No murmur heard. Pulmonary:     Effort: Pulmonary effort is normal.     Breath sounds: Normal breath sounds. No rales.  Musculoskeletal:     Right lower leg: No edema.     Left lower leg: No edema.  Skin:    General: Skin is warm and dry.     Comments: Minimal dry skin, slight flaking on left scalp.  face without significant rash at present time.  Neurological:     Mental Status: He is alert and oriented to person, place, and time.  Psychiatric:        Mood and Affect: Mood normal.     Assessment & Plan:  SALIOU BARNIER is a 41 y.o. male . Psoriasis - Plan: triamcinolone (KENALOG) 0.025 % cream, Triamcinolone Acetonide 0.025 % LOTN  -Overall stable with use of triamcinolone  lotion to scalp, cream for face as above.  May have component of seborrheic dermatitis but overall stable with current treatment including selenium shampoo.  Hold on antifungal shampoo at this time  RTC precautions.   Hyperlipidemia, unspecified hyperlipidemia type  -Plan on fasting labs at upcoming physical next few months.  Can recheck albumin at that time as well.  Recurrent hsv infection  -Minimal change in frequency of flares with 500 mg Valtrex dosing, but dizziness at 1000 mg daily dosing.  Option of  intermittent 1000 mg few days per week to see if that may lessen recurrence and improve tolerance versus daily dosing.  RTC precautions.  - 12/08/21 addendum - condition managed by other provider. Can discuss med changes/adjustments with that treating provider.   Meds ordered this encounter  Medications   triamcinolone (KENALOG) 0.025 % cream    Sig: Apply sparingly to affected areas.    Dispense:  30 g    Refill:  2   Triamcinolone Acetonide 0.025 % LOTN    Sig: Apply sparingly to affected areas.    Dispense:  60 mL    Refill:  2   Patient Instructions  Try taking valtrex '1000mg'$  2 days on, 1-2 days off and see if that is better tolerated and less frequent flares. If dizzy - lower dose.  Let me know if any questions or changes needed.   No change in cream/lotion if working.   Fasting labs at physical in next few months.   Thanks for coming in today.   If you have lab work done today you will be contacted with your lab results within the next 2 weeks.  If you have not heard from [place] then please contact us. The fastest way to get your results is to register for My Chart.   IF you received an x-ray today, you will receive an invoice from [facility]. Please contact [facility] at [phone] with questions or concerns regarding your invoice.   IF you received labwork today, you will receive an invoice from [facility]. Please contact [facility] at [phone] with  questions or concerns regarding your invoice.   Our billing staff will not be able to assist you with questions regarding bills from these companies.  You will be contacted with the lab results as soon as they are available. The fastest way to get your results is to activate your My Chart account. Instructions are located on the last page of this paperwork. If you have not heard from [place] regarding the results in 2 weeks, please contact this office.       Signed,   Merri Ray, MD [place] 10/15/21 10:48 AM

## 2021-10-14 NOTE — Patient Instructions (Addendum)
Try taking valtrex '1000mg'$  2 days on, 1-2 days off and see if that is better tolerated and less frequent flares. If dizzy - lower dose.  ?Let me know if any questions or changes needed.  ? ?No change in cream/lotion if working.  ? ?Fasting labs at physical in next few months.  ? ?Thanks for coming in today.  ? ?If you have lab work done today you will be contacted with your lab results within the next 2 weeks.  If you have not heard from [place] then please contact us. The fastest way to get your results is to register for My Chart. ? ? ?IF you received an x-ray today, you will receive an invoice from [facility]. Please contact [facility] at [phone] with questions or concerns regarding your invoice.  ? ?IF you received labwork today, you will receive an invoice from [facility]. Please contact [facility] at [phone] with questions or concerns regarding your invoice.  ? ?Our billing staff will not be able to assist you with questions regarding bills from these companies. ? ?You will be contacted with the lab results as soon as they are available. The fastest way to get your results is to activate your My Chart account. Instructions are located on the last page of this paperwork. If you have not heard from [place] regarding the results in 2 weeks, please contact this office. ?  ?  ?

## 2021-11-29 ENCOUNTER — Telehealth: Payer: Self-pay

## 2021-11-29 NOTE — Telephone Encounter (Signed)
Patient contacted me stating that he had a recent visit with Dr.Greene on 10/14/21. The main issue they discussed was his psoriasis, and when he came into this appointment Dr.Greene asked him about his reoccurring genital herpes to which the patient declined to speak about.Paula received a bill in the mail, to which he contacted his insurance company and they explained what the $140 charge was for. Ayansh stated that Dr.Greene violated 42 CFR section 2, and then explained that he was recording our conversation. I apologized to the patient that he is having to go through this, and explained that I was going to have [place] dig into this for him to see what we can do further. Patient then states that [employer] has to have a written letter explaining that they did not discuss this issue, and that it was going to be removed from patients chart. Then patient went on to say that he would like the billing number so he can have a recording of them as well that explains what the charge was for.   Can we look into this for the patient?

## 2021-12-03 NOTE — Telephone Encounter (Signed)
Message received. Kelly aware and will be looking into concerns further.

## 2021-12-08 NOTE — Telephone Encounter (Signed)
I appreciate the calls and further clarification.  I have addended the office visit to reflect psoriasis as the primary diagnosis, level of service was not changed as coded as 99213.  Additional information provided on the HSV section on assessment and plan to reflect that this condition is being treated by other provider.  I also have placed a note in family med comments, and note from myself in [facility] to help as a reminder for future visits regarding this information.  Please let me know if further information or changes are needed.  Thank you again for the assistance.

## 2021-12-08 NOTE — Telephone Encounter (Signed)
I have made the follow up call to the patient, I felt comfortable enough to do this on my own. Below is the last email I received from [name]:   "[name], the patient wanted his diagnosis for psoriasis to be the 1st diagnosis so his insurance would pay.  The billing folks change the diagnosis code order, but they didn't refile the claim in error.  I called Aron Baba in billing about this and asked her to make sure the claim got refiled w/ Psoriasis as the primary diagnosis.  She said she would.  I hope this helps for your call with Walter Evans."  This is what I have updated the patient with and I have reached out to [facility] for a follow up on where we are with this on the billing side of things. I let the patient know that I would continue to give him updates, which he was ok with. Patient wanted to make sure that in his future visits, the genital herpes NOT be discussed as he has that addressed with another provider and insurance covers it. He did not want to discuss it here for that reason of insurance not covering it. He understands that what Dr Carlota Raspberry was doing is considered comprehensive care, to which he was thankful that he has a doctor that wants to be sure he is being taken care of. He is still continuing his care with [place], just wanted to be sure this was noted for future visits.

## 2022-01-19 ENCOUNTER — Other Ambulatory Visit (HOSPITAL_COMMUNITY)
Admission: RE | Admit: 2022-01-19 | Discharge: 2022-01-19 | Disposition: A | Discharge status: Home / Self Care | Payer: No Typology Code available for payment source | Source: Ambulatory Visit | Attending: Family Medicine | Admitting: Family Medicine

## 2022-01-19 ENCOUNTER — Ambulatory Visit (INDEPENDENT_AMBULATORY_CARE_PROVIDER_SITE_OTHER): Payer: No Typology Code available for payment source | Admitting: Family Medicine

## 2022-01-19 ENCOUNTER — Encounter: Payer: Self-pay | Admitting: Family Medicine

## 2022-01-19 ENCOUNTER — Telehealth: Payer: Self-pay | Admitting: Genetic Counselor

## 2022-01-19 VITALS — BP 122/84 | HR 91 | Temp 98.3°F | Resp 16 | Ht 73.0 in | Wt 224.1 lb

## 2022-01-19 DIAGNOSIS — Z113 Encounter for screening for infections with a predominantly sexual mode of transmission: Secondary | ICD-10-CM | POA: Diagnosis present

## 2022-01-19 DIAGNOSIS — Z0000 Encounter for general adult medical examination without abnormal findings: Secondary | ICD-10-CM

## 2022-01-19 DIAGNOSIS — E785 Hyperlipidemia, unspecified: Secondary | ICD-10-CM | POA: Diagnosis not present

## 2022-01-19 DIAGNOSIS — Z800 Family history of malignant neoplasm of digestive organs: Secondary | ICD-10-CM | POA: Diagnosis not present

## 2022-01-19 DIAGNOSIS — Z809 Family history of malignant neoplasm, unspecified: Secondary | ICD-10-CM | POA: Diagnosis not present

## 2022-01-19 LAB — LIPID PANEL
Cholesterol: 271 mg/dL — ABNORMAL HIGH (ref 0–200)
HDL: 47.2 mg/dL (ref >39.00)
NonHDL: 223.97
Total CHOL/HDL Ratio: 6
Triglycerides: 239 mg/dL — ABNORMAL HIGH (ref 0.0–149.0)
VLDL: 47.8 mg/dL — ABNORMAL HIGH (ref 0.0–40.0)

## 2022-01-19 LAB — COMPREHENSIVE METABOLIC PANEL
ALT: 17 U/L (ref 0–53)
AST: 20 U/L (ref 0–37)
Albumin: 4.9 g/dL (ref 3.5–5.2)
Alkaline Phosphatase: 84 U/L (ref 39–117)
BUN: 13 mg/dL (ref 6–23)
CO2: 28 mEq/L (ref 19–32)
Calcium: 10 mg/dL (ref 8.4–10.5)
Chloride: 101 mEq/L (ref 96–112)
Creatinine, Ser: 1.02 mg/dL (ref 0.40–1.50)
GFR: 93.07 mL/min (ref >60.00)
Glucose, Bld: 95 mg/dL (ref 70–99)
Potassium: 4.2 mEq/L (ref 3.5–5.1)
Sodium: 136 mEq/L (ref 135–145)
Total Bilirubin: 0.6 mg/dL (ref 0.2–1.2)
Total Protein: 7.5 g/dL (ref 6.0–8.3)

## 2022-01-19 LAB — LDL CHOLESTEROL, DIRECT: Direct LDL: 181 mg/dL

## 2022-01-19 NOTE — Progress Notes (Signed)
Subjective:  Patient ID: Walter Evans, male    DOB: 1981-04-30  Age: 41 y.o. MRN: [id number]  CC:  Chief Complaint  Patient presents with   Annual Exam    Annual exam  Doing well     HPI Walter Evans presents for Annual Exam  Care team: PCP - me Ortho/PMR: Dr. Nelva Bush GIFuller Plan - colonoscopy July 2022.  Optho [facility]  Recently seen in April, discussed psoriasis at that time and refilled his triamcinolone lotion and cream.  History of hyperlipidemia with planned diet/exercise approach. borderline albumin level on previous labs.  Has been evaluated by allergist, orthopaedic spine specialist. Recurrent rash, usually after flare of pain. Thought rash due to stress body reaction -  reactive to his underlying pain. Managed with allegra at this time. No recurrence on allegra past 2 weeks.   Fasting today.  Exercising more, and improved diet since last labs.  Unable to take fish oil - allergic to all fish.  Lab Results  Component Value Date   CHOL 279 (H) 10/23/2019   HDL 51 10/23/2019   LDLCALC 153 (H) 10/23/2019   TRIG 402 (H) 10/23/2019   CHOLHDL 5.5 (H) 10/23/2019   Wt Readings from Last 3 Encounters:  01/19/22 224 lb 2 oz (101.7 kg)  10/14/21 223 lb 12.8 oz (101.5 kg)  05/19/21 217 lb 9.6 oz (98.7 kg)   Lab Results  Component Value Date   ALT 18 10/23/2019   AST 21 10/23/2019   ALKPHOS 113 10/23/2019   BILITOT 0.3 10/23/2019       01/19/2022    9:10 AM 10/14/2021    9:04 AM 05/19/2021    8:57 AM 11/04/2020    3:07 PM 09/21/2020    8:39 AM  Depression screen PHQ 2/9  Decreased Interest 0 1 0 0 0  Down, Depressed, Hopeless 0 0 0 0 0  PHQ - 2 Score 0 1 0 0 0  Altered sleeping '1 1 2    '$ Tired, decreased energy 0 0 1    Change in appetite 0 0 1    Feeling bad or failure about yourself  0 0 0    Trouble concentrating 0 0 0    Moving slowly or fidgety/restless 1 0 0    Suicidal thoughts 0 0 0    PHQ-9 Score '2 2 4    '$ Difficult doing work/chores Not  difficult at all Not difficult at all       Health Maintenance  Topic Date Due   INFLUENZA VACCINE  02/08/2022   COLONOSCOPY (Pts 45-21yr Insurance coverage will need to be confirmed)  01/13/2026   TETANUS/TDAP  02/11/2026   COVID-19 Vaccine  Completed   Hepatitis C Screening  Completed   HIV Screening  Completed   HPV VACCINES  Aged Out   Colonoscopy with hyperplastic polyp, melanosis coli, sessile serrated polyp without cytologic dysplasia January 13, 2021.  Dr. SFuller Plan Repeat at age 41 every 5 years. Family history of colon CA - maternal - mom and GM, paternal GF.  No FH of prostate CA, but other cancer in family. Would like to meet with genetic counselor to discuss screenings.   Immunization History  Administered Date(s) Administered   Hepatitis A, Adult 08/02/2016, 03/16/2017   Meningococcal Mcv4o 08/02/2016   Pfizer Covid-19 Vaccine Bivalent Booster 117yr& up 04/14/2021   Tdap 02/12/2016  Monkeypox vaccines received at health dept.  No results found. Has seen optho 2 years ago. No contacts/glasses.  Dental: looking for new dentist.   Alcohol: 1 per week  STI screening: no symptoms, no new contacts requests screening.   Tobacco: none, no vaping.   Exercise: 2-3 days per week.calisthenics, 15-20 mins.    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 Authorizing Provider  triamcinolone (KENALOG) 0.025 % cream Apply sparingly to affected areas. 10/14/21  Yes Wendie Agreste, MD  Triamcinolone Acetonide 0.025 % LOTN Apply sparingly to affected areas. 10/14/21  Yes Wendie Agreste, MD   Social History   Socioeconomic History   Marital status: Divorced    Spouse name: Carissa    Number of children: 0   Years of education: 12   Highest education level: Not on file  Occupational History   Occupation: Therapist, occupational and sales  Tobacco Use   Smoking status: Never   Smokeless tobacco: Never  Vaping Use   Vaping Use: Never used  Substance and Sexual Activity   Alcohol use: Yes    Alcohol/week: 0.0 - 2.0 standard drinks of alcohol   Drug use: Never   Sexual activity: Yes    Partners: Male, Male  Other Topics Concern   Not on file  Social History Narrative   Patient lives at home with wife [name].   Patient is ambidextrous    I'm a moderately active vegetarian. But I like [place]. But I drink a lot of water.   1 cup of coffee, 6-7 cups of tea/day.   Social Determinants of Health   Financial Resource Strain: Not on file  Food Insecurity: Not on file  Transportation Needs: Not on file  Physical Activity: Not on file  Stress: Not on file  Social Connections: Not on file  Intimate Partner Violence: Not on file    Review of Systems Per HPI   Objective:   Vitals:   01/19/22 0911  BP: 122/84  Pulse: 91  Resp: 16  Temp: 98.3 F (36.8 C)  TempSrc: Temporal  SpO2: 98%  Weight: 224 lb 2 oz (101.7 kg)  Height: '6\' 1"'$  (1.854 m)     Physical  Exam Vitals reviewed.  Constitutional:      Appearance: He is well-developed.  HENT:     Head: Normocephalic and atraumatic.     Right Ear: External ear normal.     Left Ear: External ear normal.  Eyes:  Conjunctiva/sclera: Conjunctivae normal.     Pupils: Pupils are equal, round, and reactive to light.  Neck:     Thyroid: No thyromegaly.  Cardiovascular:     Rate and Rhythm: Normal rate and regular rhythm.     Heart sounds: Normal heart sounds.  Pulmonary:     Effort: Pulmonary effort is normal. No respiratory distress.     Breath sounds: Normal breath sounds. No wheezing.  Abdominal:     General: There is no distension.     Palpations: Abdomen is soft.     Tenderness: There is no abdominal tenderness.  Musculoskeletal:        General: No tenderness.     Cervical back: Normal range of motion and neck supple.     Right lower leg: No edema.     Left lower leg: No edema.  Lymphadenopathy:     Cervical: No cervical adenopathy.  Skin:    General: Skin is warm and dry.  Neurological:     Mental Status: He is alert and oriented to person, place, and time.     Deep Tendon Reflexes: Reflexes are normal and symmetric.  Psychiatric:        Behavior: Behavior normal.        Assessment & Plan:  Walter Evans is a 41 y.o. male . Annual physical exam  - -anticipatory guidance as below in AVS, screening labs above. Health maintenance items as above in HPI discussed/recommended as applicable.   Family history of colon cancer - Plan: Ambulatory referral to Genetics Family history of cancer - Plan: Ambulatory referral to Genetics  -Referred to genetic counseling based on multiple family members with cancer in the family to decide on testing or change in screening intervals.  Routine screening for STI (sexually transmitted infection) - Plan: RPR, Urine cytology ancillary only, HIV Antibody (routine testing w rflx)  Hyperlipidemia, unspecified hyperlipidemia type - Plan:  Comprehensive metabolic panel, Lipid panel  -Check labs with discussion of treatments based on results.  Followed by allergist for rash, stable on Allegra, Xyzal also discussed as option if possible idiopathic urticaria.  No orders of the defined types were placed in this encounter.  Patient Instructions  Thanks for coming in today. Allegra should help with hives as recommended by allergist. Harlow Ohms is another option instead of allegra if needed. Follow up with allergist if rash returns.   I expect labs to look better, but will let you know.   I will refer you to genetic counseling to discuss your family history and determine if any other screenings recommended.   Preventive Care 35-67 Years Old, Male Preventive care refers to lifestyle choices and visits with your health care provider that can promote health and wellness. Preventive care visits are also called wellness exams. What can I expect for my preventive care visit? Counseling During your preventive care visit, your health care provider may ask about your: Medical history, including: Past medical problems. Family medical history. Current health, including: Emotional well-being. Home life and relationship well-being. Sexual activity. Lifestyle, including: Alcohol, nicotine or tobacco, and drug use. Access to firearms. Diet, exercise, and sleep habits. Safety issues such as seatbelt and bike helmet use. Sunscreen use. Work and work Statistician. Physical exam Your health care provider may check your: Height and weight. These may be used to calculate your BMI (body mass index). BMI is a measurement that tells if you are at a healthy weight. Waist circumference. This measures the distance around your waistline. This measurement also  tells if you are at a healthy weight and may help predict your risk of certain diseases, such as type 2 diabetes and high blood pressure. Heart rate and blood pressure. Body temperature.  Vaccines  are usually given at various ages, according to a schedule. Your health care provider will recommend vaccines for you based on your age, medical history, and lifestyle or other factors, such as travel or where you work. What tests do I need? Screening Your health care provider may recommend screening tests for certain conditions. This may include: Lipid and cholesterol levels. Diabetes screening. This is done by checking your blood sugar (glucose) after you have not eaten for a while (fasting). Hepatitis B test. Hepatitis C test. HIV (human immunodeficiency virus) test. STI (sexually transmitted infection) testing, if you are at risk. Talk with your health care provider about your test results, treatment options, and if necessary, the need for more tests. Follow these instructions at home: Eating and drinking  Eat a healthy diet that includes fresh fruits and vegetables, whole grains, lean protein, and low-fat dairy products. Drink enough fluid to keep your urine pale yellow. Take vitamin and mineral supplements as recommended by your health care provider. Do not drink alcohol if your health care provider tells you not to drink. If you drink alcohol: Limit how much you have to 0-2 drinks a day. Know how much alcohol is in your drink. In the [place] one drink equals one 12 oz bottle of beer (355 mL), one 5 oz glass of wine (148 mL), or one 1 oz glass of hard liquor (44 mL). Lifestyle Brush your teeth every morning and night with fluoride toothpaste. Floss one time each day. Exercise for at least 30 minutes 5 or more days each week. Do not use any products that contain nicotine or tobacco. These products include cigarettes, chewing tobacco, and vaping devices, such as e-cigarettes. If you need help quitting, ask your health care provider. Do not use drugs. If you are sexually active, practice safe sex. Use a condom or other form of protection to prevent STIs. Find healthy ways to manage  stress, such as: Meditation, yoga, or listening to music. Journaling. Talking to a trusted person. Spending time with friends and family. Minimize exposure to UV radiation to reduce your risk of skin cancer. Safety Always wear your seat belt while driving or riding in a vehicle. Do not drive: If you have been drinking alcohol. Do not ride with someone who has been drinking. If you have been using any mind-altering substances or drugs. While texting. When you are tired or distracted. Wear a helmet and other protective equipment during sports activities. If you have firearms in your house, make sure you follow all gun safety procedures. Seek help if you have been physically or sexually abused. What's next? Go to your health care provider once a year for an annual wellness visit. Ask your health care provider how often you should have your eyes and teeth checked. Stay up to date on all vaccines. This information is not intended to replace advice given to you by your health care provider. Make sure you discuss any questions you have with your health care provider. Document Revised: 12/23/2020 Document Reviewed: 12/23/2020 Elsevier Patient Education  [place],   Merri Ray, MD [facility] 01/19/22 10:37 AM

## 2022-01-19 NOTE — Telephone Encounter (Signed)
Scheduled appt per 7/12 referral. Pt is aware of appt date and time. Pt is aware to arrive 15 mins prior to appt time and to bring and updated insurance card. Pt is aware of appt location.   

## 2022-01-19 NOTE — Patient Instructions (Addendum)
Thanks for coming in today. Allegra should help with hives as recommended by allergist. Walter Evans is another option instead of allegra if needed. Follow up with allergist if rash returns.   I expect labs to look better, but will let you know.   I will refer you to genetic counseling to discuss your family history and determine if any other screenings recommended.   Preventive Care 85-39 Years Old, Male Preventive care refers to lifestyle choices and visits with your health care provider that can promote health and wellness. Preventive care visits are also called wellness exams. What can I expect for my preventive care visit? Counseling During your preventive care visit, your health care provider may ask about your: Medical history, including: Past medical problems. Family medical history. Current health, including: Emotional well-being. Home life and relationship well-being. Sexual activity. Lifestyle, including: Alcohol, nicotine or tobacco, and drug use. Access to firearms. Diet, exercise, and sleep habits. Safety issues such as seatbelt and bike helmet use. Sunscreen use. Work and work Statistician. Physical exam Your health care provider may check your: Height and weight. These may be used to calculate your BMI (body mass index). BMI is a measurement that tells if you are at a healthy weight. Waist circumference. This measures the distance around your waistline. This measurement also tells if you are at a healthy weight and may help predict your risk of certain diseases, such as type 2 diabetes and high blood pressure. Heart rate and blood pressure. Body temperature.  Vaccines are usually given at various ages, according to a schedule. Your health care provider will recommend vaccines for you based on your age, medical history, and lifestyle or other factors, such as travel or where you work. What tests do I need? Screening Your health care provider may recommend screening tests for  certain conditions. This may include: Lipid and cholesterol levels. Diabetes screening. This is done by checking your blood sugar (glucose) after you have not eaten for a while (fasting). Hepatitis B test. Hepatitis C test. HIV (human immunodeficiency virus) test. STI (sexually transmitted infection) testing, if you are at risk. Talk with your health care provider about your test results, treatment options, and if necessary, the need for more tests. Follow these instructions at home: Eating and drinking  Eat a healthy diet that includes fresh fruits and vegetables, whole grains, lean protein, and low-fat dairy products. Drink enough fluid to keep your urine pale yellow. Take vitamin and mineral supplements as recommended by your health care provider. Do not drink alcohol if your health care provider tells you not to drink. If you drink alcohol: Limit how much you have to 0-2 drinks a day. Know how much alcohol is in your drink. In the [place] one drink equals one 12 oz bottle of beer (355 mL), one 5 oz glass of wine (148 mL), or one 1 oz glass of hard liquor (44 mL). Lifestyle Brush your teeth every morning and night with fluoride toothpaste. Floss one time each day. Exercise for at least 30 minutes 5 or more days each week. Do not use any products that contain nicotine or tobacco. These products include cigarettes, chewing tobacco, and vaping devices, such as e-cigarettes. If you need help quitting, ask your health care provider. Do not use drugs. If you are sexually active, practice safe sex. Use a condom or other form of protection to prevent STIs. Find healthy ways to manage stress, such as: Meditation, yoga, or listening to music. Journaling. Talking to a trusted person.  Spending time with friends and family. Minimize exposure to UV radiation to reduce your risk of skin cancer. Safety Always wear your seat belt while driving or riding in a vehicle. Do not drive: If you have been  drinking alcohol. Do not ride with someone who has been drinking. If you have been using any mind-altering substances or drugs. While texting. When you are tired or distracted. Wear a helmet and other protective equipment during sports activities. If you have firearms in your house, make sure you follow all gun safety procedures. Seek help if you have been physically or sexually abused. What's next? Go to your health care provider once a year for an annual wellness visit. Ask your health care provider how often you should have your eyes and teeth checked. Stay up to date on all vaccines. This information is not intended to replace advice given to you by your health care provider. Make sure you discuss any questions you have with your health care provider. Document Revised: 12/23/2020 Document Reviewed: 12/23/2020 Elsevier Patient Education  [place].

## 2022-01-20 LAB — URINE CYTOLOGY ANCILLARY ONLY
Chlamydia: NEGATIVE
Comment: NEGATIVE
Comment: NEGATIVE
Comment: NORMAL
Neisseria Gonorrhea: NEGATIVE
Trichomonas: NEGATIVE

## 2022-01-20 LAB — RPR: RPR Ser Ql: NONREACTIVE

## 2022-01-20 LAB — HIV ANTIBODY (ROUTINE TESTING W REFLEX): HIV 1&2 Ab, 4th Generation: NONREACTIVE

## 2022-01-24 ENCOUNTER — Other Ambulatory Visit (HOSPITAL_BASED_OUTPATIENT_CLINIC_OR_DEPARTMENT_OTHER): Payer: Self-pay

## 2022-01-24 NOTE — Progress Notes (Signed)
Informed pt of lab results  

## 2022-02-24 ENCOUNTER — Other Ambulatory Visit (HOSPITAL_BASED_OUTPATIENT_CLINIC_OR_DEPARTMENT_OTHER): Payer: Self-pay

## 2022-02-24 MED ORDER — PREDNISONE 10 MG PO TABS
ORAL_TABLET | ORAL | 0 refills | Status: DC
  Filled 2022-02-24: qty 21, 6d supply, fill #0

## 2022-03-15 ENCOUNTER — Encounter: Payer: Self-pay | Admitting: Genetic Counselor

## 2022-03-15 ENCOUNTER — Inpatient Hospital Stay: Payer: No Typology Code available for payment source

## 2022-03-15 ENCOUNTER — Inpatient Hospital Stay: Payer: No Typology Code available for payment source | Attending: Family Medicine | Admitting: Genetic Counselor

## 2022-03-15 ENCOUNTER — Other Ambulatory Visit: Payer: Self-pay

## 2022-03-15 DIAGNOSIS — Z800 Family history of malignant neoplasm of digestive organs: Secondary | ICD-10-CM

## 2022-03-15 NOTE — Progress Notes (Signed)
REFERRING PROVIDER: Wendie Agreste, MD [address] [place],  [place] [address]  PRIMARY PROVIDER:  Wendie Agreste, MD  PRIMARY REASON FOR VISIT:  Encounter Diagnosis  Name Primary?   Family history of colon cancer Yes   HISTORY OF PRESENT ILLNESS:   Walter Evans, a 41 y.o. male, was seen for a  cancer genetics consultation at the request of Dr. Carlota Raspberry due to a family history of colon cancer.  Walter Evans presents to clinic today to discuss the possibility of a hereditary predisposition to cancer, to discuss genetic testing, and to further clarify his future cancer risks, as well as potential cancer risks for family members.   Walter Evans is a 41 y.o. male with no personal history of cancer. His most recent colonoscopy was in July 2022 and 2 polyps were identified. One polyp was sessile serrated and the second was a hyperplastic polyp.    Past Medical History:  Diagnosis Date   Allergy    Arthritis    RIGHT knee and back   Back pain    L3-L4   HSV-2 (herpes simplex virus 2) infection    MRSA (methicillin resistant Staphylococcus aureus)    Neuromuscular disorder (HCC)    Psoriasis     Past Surgical History:  Procedure Laterality Date   KNEE ARTHROTOMY Right    TONSILLECTOMY  07/11/2005   WISDOM TOOTH EXTRACTION  07/11/2001    Social History   Socioeconomic History   Marital status: Divorced    Spouse name: Doctor, hospital    Number of children: 0   Years of education: 12   Highest education level: Not on file  Occupational History   Occupation: Production manager  Tobacco Use   Smoking status: Never   Smokeless tobacco: Never  Vaping Use   Vaping Use: Never used  Substance and Sexual Activity   Alcohol use: Yes    Alcohol/week: 0.0 - 2.0 standard drinks of alcohol   Drug use: Never   Sexual activity: Yes    Partners: Male, Male  Other Topics Concern   Not on file  Social History Narrative   Patient lives at home with wife [name].   Patient is  ambidextrous    I'm a moderately active vegetarian. But I like [place]. But I drink a lot of water.   1 cup of coffee, 6-7 cups of tea/day.   Social Determinants of Health   Financial Resource Strain: Not on file  Food Insecurity: Not on file  Transportation Needs: Not on file  Physical Activity: Not on file  Stress: Not on file  Social Connections: Not on file     FAMILY HISTORY:  We obtained a detailed, 4-generation family history.  Significant diagnoses are listed below: Family History  Problem Relation Age of Onset   Hypertension Mother    Cancer - Colon Mother 108       hemicolectomy   Heart disease Mother    Colon polyps Mother    Hypertension Father    Cancer Father        "a benign form of cancer that causes tumors in the ear."   Hyperlipidemia Father    Colon polyps Father    Skin cancer Maternal Uncle    Melanoma Paternal Uncle    Heart disease Maternal Grandmother    Colon cancer Maternal Grandmother 60 - 46       dx. 41s and again in her 73s   Heart disease Maternal Grandfather  Breast cancer Paternal Grandmother 83 - 79   Colon cancer Paternal Grandmother 20 - 66     Walter Evans' mother was diagnosed with colon cancer at age 24 and had a hemicolectomy as part of her treatment. She was also found to have a benign precancerous skin finding. His maternal uncle was diagnosed with an unknown type of skin cancer at age 79. His maternal grandmother was diagnosed with colon cancer in her 43's and had a hemicolectomy as part of her treatment. She was later diagnosed with colon cancer again in her 82s, Walter Evans does not know if the second cancer was a recurrence or second primary.   Walter Evans' father has benign brain tumors that sometimes appear in his cochlea as well. He also has benign skin findings. These benign findings began in in his 57's. His paternal uncle was diagnosed with melanoma at age 62. His paternal grandmother was diagnosed with breast cancer at age  36 and colon cancer in her 48s.  Walter Evans is unaware of previous family history of genetic testing for hereditary cancer risks. There is no reported Ashkenazi Jewish ancestry.  GENETIC COUNSELING ASSESSMENT: Walter Evans is a 41 y.o. male with a family history of cancer which is somewhat suggestive of a hereditary predisposition to cancer given multiple generations affected with colon cancer. We, therefore, discussed and recommended the following at today's visit.   DISCUSSION: We discussed that 5 - 10% of cancer is hereditary, with most cases of hereditary colon cancer associated with Lynch Syndrome.  There are other genes that can be associated with hereditary colon cancer syndromes.  We discussed that testing is beneficial for several reasons, including knowing about other cancer risks, identifying potential screening and risk-reduction options that may be appropriate, and to understanding if other family members could be at risk for cancer and allowing them to undergo genetic testing.  We reviewed the characteristics, features and inheritance patterns of hereditary cancer syndromes. We also discussed genetic testing, including the appropriate family members to test, the process of testing, insurance coverage and turn-around-time for results. We discussed the implications of a negative, positive, carrier and/or variant of uncertain significant result. We discussed that negative results would be uninformative given that Walter Evans does not have a personal history of cancer. We recommended Walter Evans pursue genetic testing for a panel that contains genes associated with colon cancer, breast cancer, melanoma, and polyposis.  Walter Evans was offered a common hereditary cancer panel (47 genes+ BAP1, MITF, NF2, POT1, RB1) and an expanded pan-cancer panel (77 genes). Walter Evans was informed of the benefits and limitations of each panel, including that expanded pan-cancer panels contain several genes that do not have  clear management guidelines at this point in time.  We also discussed that as the number of genes included on a panel increases, the chances of variants of uncertain significance increases.  After considering the benefits and limitations of each gene panel, Walter Evans elected to have [facility] Common Cancer Panel+ BAP1, MITF, NF2, POT1, RB1 (52 genes).   Based on Walter Evans family history of cancer, he does not quite meet medical criteria for genetic testing. He may have an out of pocket cost. We discussed that if his out of pocket cost for testing is over $100, the laboratory will call and confirm whether he wants to proceed with testing.  If the out of pocket cost of testing is less than $100 he will be billed by the genetic testing laboratory.  We discussed that some people do not want to undergo genetic testing due to fear of genetic discrimination.  A federal law called the Genetic Information Non-Discrimination Act (GINA) of 2008 helps protect individuals against genetic discrimination based on their genetic test results.  It impacts both health insurance and employment.  With health insurance, it protects against increased premiums, being kicked off insurance or being forced to take a test in order to be insured.  For employment it protects against hiring, firing and promoting decisions based on genetic test results.  GINA does not apply to those in the [employer], those who work for companies with less than 15 employees, and new life insurance or long-term disability insurance policies.  Health status due to a cancer diagnosis is not protected under GINA.  PLAN: After considering the risks, benefits, and limitations, Walter Evans provided informed consent to pursue genetic testing and the blood sample was sent to [facility] for analysis of the Common Cancer Panel+ BAP1, MITF, NF2, POT1, RB1 (52 genes). Results should be available within approximately 2-3 weeks' time, at which point they will be  disclosed by telephone to Walter Evans, as will any additional recommendations warranted by these results. Walter Evans will receive a summary of his genetic counseling visit and a copy of his results once available. This information will also be available in Epic.   Mr. Azizi questions were answered to his satisfaction today. Our contact information was provided should additional questions or concerns arise. Thank you for the referral and allowing [place] to share in the care of your patient.   Lucille Passy, MS, [facility] Genetic Counselor [email or web address] (P) [phone]  The patient was seen for a total of 35 minutes in face-to-face genetic counseling. The patient was seen alone.  Drs. Lindi Adie and/or Burr Medico were available to discuss this case as needed.  _______________________________________________________________________ For Office Staff:  Number of people involved in session: 1 Was an Intern/ student involved with case: yes- [place]

## 2022-03-30 ENCOUNTER — Encounter: Payer: Self-pay | Admitting: Genetic Counselor

## 2022-03-30 ENCOUNTER — Inpatient Hospital Stay (HOSPITAL_BASED_OUTPATIENT_CLINIC_OR_DEPARTMENT_OTHER): Payer: No Typology Code available for payment source | Admitting: Genetic Counselor

## 2022-03-30 ENCOUNTER — Other Ambulatory Visit: Payer: Self-pay

## 2022-03-30 DIAGNOSIS — Z1379 Encounter for other screening for genetic and chromosomal anomalies: Secondary | ICD-10-CM | POA: Insufficient documentation

## 2022-03-30 DIAGNOSIS — Z1589 Genetic susceptibility to other disease: Secondary | ICD-10-CM | POA: Insufficient documentation

## 2022-03-30 NOTE — Progress Notes (Signed)
HPI:   Mr. Talarico was previously seen in the [facility] clinic due to a family history of cancer and concerns regarding a hereditary predisposition to cancer. Please refer to our prior cancer genetics clinic note for more information regarding our discussion, assessment and recommendations, at the time. Mr. Cull recent genetic test results were disclosed to him, as were recommendations warranted by these results. These results and recommendations are discussed in more detail below.  CANCER HISTORY:  Oncology History   No history exists.    FAMILY HISTORY:  We obtained a detailed, 4-generation family history.  Significant diagnoses are listed below: Family History  Problem Relation Age of Onset   Hypertension Mother    Cancer - Colon Mother 36       hemicolectomy   Heart disease Mother    Colon polyps Mother    Hypertension Father    Cancer Father        "a benign form of cancer that causes tumors in the ear."   Hyperlipidemia Father    Colon polyps Father    Heart disease Maternal Grandmother    Colon cancer Maternal Grandmother 60 - 40       dx. 51s and again in her 12s   Heart disease Maternal Grandfather    Breast cancer Paternal Grandmother 61 - 79   Colon cancer Paternal Grandmother 80 - 89   Skin cancer Maternal Uncle 70   Bladder Cancer Maternal Uncle 90   Melanoma Paternal Uncle         Mr. Fedor' mother was diagnosed with colon cancer at age 54 and had a hemicolectomy as part of her treatment. She was also found to have a benign precancerous skin finding. His maternal uncle was diagnosed with an unknown type of skin cancer at age 58 and bladder cancer at age 63. His maternal grandmother was diagnosed with colon cancer in her 71's and had a hemicolectomy as part of her treatment. She was later diagnosed with colon cancer again in her 7s, Mr. Keimig does not know if the second cancer was a recurrence or second primary.    Mr. Sumlin' father has benign  brain tumors that sometimes appear in his cochlea as well. He also has benign skin findings. These benign findings began in in his 77's. His paternal uncle was diagnosed with melanoma at age 23. His paternal grandmother was diagnosed with breast cancer at age 1 and colon cancer in her 10s.   Mr. Sprung is unaware of previous family history of genetic testing for hereditary cancer risks. There is no reported Ashkenazi Jewish ancestry.  GENETIC TEST RESULTS:  Mr. 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 Specifically, this variant is c.536A>G.  Genetic testing identified a variant of uncertain significance (VUS) in the BAP1 gene called c.1628G>A.  At this time, it is unknown if this variant is associated with an increased risk for cancer or if it is benign, but most uncertain variants are reclassified to benign. It should not be used to make medical management decisions. With time, we suspect the laboratory will determine the significance of this variant, if any. If the laboratory reclassifies this variant, we will attempt to contact Mr. Furry to discuss it further.   The test report has been scanned into EPIC and is located under the Molecular Pathology section of the Results Review tab.  A portion of the result  report is included below for reference. Genetic testing reported out on 03/24/2022.      Clinical Information: Changes in the MUTYH gene are associated with MUTYH-associated Polyposis syndrome (MAP). MAP is a hereditary cancer condition in which patients have high risks for colorectal cancer due to a large number of adenomatous polyps in the gastrointestinal system. MAP is unique among the hereditary cancer syndromes in that it is a "recessive" condition. Most hereditary cancer conditions are "dominant", meaning the condition is present in patients with a mutation in one copy of the  gene. Patients have MAP only if there are mutations in both of their copies of the MUTYH gene. Mr. 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 It is estimated that 1% to 2% of the population has a mutation in one copy of the MUTYH gene. These individuals do not have MAP.   There is conflicting information regarding colon cancer risks for MUTYH carriers. Some studies have reported a 2-3 fold increased risk-- more so in individuals with a family history of colon cancer. Other studies have found no substantial evidence supporting MUTYH carriers having increased risks of colon cancer. At this time, the [employer]  recommends screening for MUTYH carriers based on family history of colon cancer.  Research is continuing to help learn more about the cancers associated with MUTYH pathogenic variants and what the exact risks are to develop these cancers.   Management Recommendations for individuals with a single MUTYH pathogenic variant:  Colon Cancer Screening: If an individual has a first-degree relative with colorectal cancer, screening should begin at age 2 or 3 years prior to the relative's age at diagnosis, whichever comes first and repeat colonoscopies every 5 years.   If an individual has no first-degree relatives with colorectal cancer, data is unclear if specialized screening is warranted. We advise these patients to follow their gastroenterologists recommendations.  This information is based on current understanding of the gene and may change in the future.   Implications for Family Members: Mr. Carlisi first degree relatives are at a 50% risk for also having inherited the MUTYH mutation. Family members may consider genetic testing for this familial pathogenic variant. As there are generally no childhood cancer risks associated with pathogenic variants in the MUTYH gene, individuals in the family are  not recommended to have testing until they reach at least 39 years of age. They may contact our office at [phone] for more information or to schedule an appointment.  Complimentary testing for the familial variant is available for 90 days from the report date.  Family members who live outside of the area are encouraged to find a genetic counselor in their area by visiting: [email or web address].   Additional Information: Even though a pathogenic variant was not identified that explains his family history of cancer, possible explanations for the cancer in the family may include: There may be no hereditary risk for cancer in the family. The cancers in his family may be due to other genetic or environmental factors. There may be a gene mutation in one of these genes that current testing methods cannot detect, but that chance is small. There could be another gene that has not yet been discovered, or that we have not yet tested, that is responsible for the cancer diagnoses in the family.  It is also possible there is a hereditary cause for the cancer in the family that Mr. Pereira did not  inherit.  Therefore, it is important to remain in touch with cancer genetics in the future so that we can continue to offer Mr. Dissinger the most up to date genetic testing.   FOLLOW-UP:  Cancer genetics is a rapidly advancing field and it is possible that new genetic tests will be appropriate for him and/or his family members in the future. We encouraged him to remain in contact with cancer genetics on an annual basis so we can update his personal and family histories and let him know of advances in cancer genetics that may benefit this family.   Our contact number was provided. Mr. Koors questions were answered to his satisfaction, and he knows he is welcome to call us at anytime with additional questions or concerns.   Lucille Passy, MS, [facility] Genetic  Counselor [email or web address] (P) [phone]

## 2022-03-31 ENCOUNTER — Encounter: Payer: Self-pay | Admitting: Genetic Counselor

## 2022-04-22 ENCOUNTER — Other Ambulatory Visit (HOSPITAL_BASED_OUTPATIENT_CLINIC_OR_DEPARTMENT_OTHER): Payer: Self-pay

## 2022-04-22 MED ORDER — COVID-19 MRNA 2023-2024 VACCINE (COMIRNATY) 0.3 ML INJECTION
INTRAMUSCULAR | 0 refills | Status: DC
  Filled 2022-04-22: qty 0.3, 1d supply, fill #0

## 2022-04-28 ENCOUNTER — Other Ambulatory Visit (HOSPITAL_BASED_OUTPATIENT_CLINIC_OR_DEPARTMENT_OTHER): Payer: Self-pay

## 2022-06-10 DIAGNOSIS — M5416 Radiculopathy, lumbar region: Secondary | ICD-10-CM | POA: Insufficient documentation

## 2022-06-17 ENCOUNTER — Other Ambulatory Visit (HOSPITAL_BASED_OUTPATIENT_CLINIC_OR_DEPARTMENT_OTHER): Payer: Self-pay

## 2022-06-17 MED ORDER — PREDNISONE 5 MG PO TABS
ORAL_TABLET | ORAL | 1 refills | Status: DC
  Filled 2022-06-17: qty 21, 6d supply, fill #0

## 2022-06-17 MED ORDER — GABAPENTIN 300 MG PO CAPS
300.0000 mg | ORAL_CAPSULE | Freq: Every evening | ORAL | 2 refills | Status: DC
  Filled 2022-06-17: qty 30, 30d supply, fill #0
  Filled 2022-07-16 (×2): qty 30, 30d supply, fill #1
  Filled 2022-08-14: qty 30, 30d supply, fill #2

## 2022-06-30 ENCOUNTER — Encounter: Payer: Self-pay | Admitting: Family Medicine

## 2022-06-30 ENCOUNTER — Other Ambulatory Visit (HOSPITAL_BASED_OUTPATIENT_CLINIC_OR_DEPARTMENT_OTHER): Payer: Self-pay

## 2022-06-30 ENCOUNTER — Ambulatory Visit: Payer: No Typology Code available for payment source | Admitting: Family Medicine

## 2022-06-30 VITALS — BP 124/72 | HR 103 | Temp 98.7°F | Ht 73.0 in | Wt 232.8 lb

## 2022-06-30 DIAGNOSIS — R0789 Other chest pain: Secondary | ICD-10-CM

## 2022-06-30 DIAGNOSIS — L409 Psoriasis, unspecified: Secondary | ICD-10-CM

## 2022-06-30 MED ORDER — TRIAMCINOLONE ACETONIDE 0.025 % EX CREA
TOPICAL_CREAM | CUTANEOUS | 2 refills | Status: DC
  Filled 2022-06-30: qty 30, 30d supply, fill #0

## 2022-06-30 NOTE — Patient Instructions (Addendum)
Based on initial area of discomfort and continued flares in same area discomfort I think that you do have a either a contusion or injury/inflammation of the xiphoid bone or sternal joint.  Topical Voltaren gel over-the-counter few times per day is reasonable for now but I would like to start with an x-ray then possible CT scan to evaluate that area further.  Depending on those results can also refer you to orthopedics.  Let me know if there are questions.  Thanks for coming in today.  Return to the clinic or go to the nearest emergency room if any of your symptoms worsen or new symptoms occur.  [facility] for xray Walk in 8:30-4:30 during weekdays, no appointment needed [address].  [place], [place] [address]    Chest Wall Pain Chest wall pain is pain in or around the bones and muscles of your chest. Sometimes, an injury causes this pain. Excessive coughing or overuse of arm and chest muscles may also cause chest wall pain. Sometimes, the cause may not be known. This pain may take several weeks or longer to get better. Follow these instructions at home: Managing pain, stiffness, and swelling  If directed, put ice on the painful area: Put ice in a plastic bag. Place a towel between your skin and the bag. Leave the ice on for 20 minutes, 2-3 times per day. Activity Rest as told by your health care provider. Avoid activities that cause pain. These include any activities that use your chest muscles or your abdominal and side muscles to lift heavy items. Ask your health care provider what activities are safe for you. General instructions  Take over-the-counter and prescription medicines only as told by your health care provider. Do not use any products that contain nicotine or tobacco, such as cigarettes, e-cigarettes, and chewing tobacco. These can delay healing after injury. If you need help quitting, ask your health care provider. Keep all follow-up visits as told by your health care  provider. This is important. Contact a health care provider if: You have a fever. Your chest pain becomes worse. You have new symptoms. Get help right away if: You have nausea or vomiting. You feel sweaty or light-headed. You have a cough with mucus from your lungs (sputum) or you cough up blood. You develop shortness of breath. These symptoms may represent a serious problem that is an emergency. Do not wait to see if the symptoms will go away. Get medical help right away. Call your local emergency services (911 in the [place] Do not drive yourself to the hospital. Summary Chest wall pain is pain in or around the bones and muscles of your chest. Depending on the cause, it may be treated with ice, rest, medicines, and avoiding activities that cause pain. Contact a health care provider if you have a fever, worsening chest pain, or new symptoms. Get help right away if you feel light-headed or you develop shortness of breath. These symptoms may be an emergency. This information is not intended to replace advice given to you by your health care provider. Make sure you discuss any questions you have with your health care provider. Document Revised: 09/11/2020 Document Reviewed: 09/11/2020 Elsevier Patient Education  [place].
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
## 2022-07-13 ENCOUNTER — Ambulatory Visit (INDEPENDENT_AMBULATORY_CARE_PROVIDER_SITE_OTHER)
Admission: RE | Admit: 2022-07-13 | Discharge: 2022-07-13 | Disposition: A | Discharge status: Home / Self Care | Payer: No Typology Code available for payment source | Source: Ambulatory Visit | Attending: Family Medicine | Admitting: Family Medicine

## 2022-07-13 DIAGNOSIS — R0789 Other chest pain: Secondary | ICD-10-CM

## 2022-07-14 ENCOUNTER — Telehealth: Payer: Self-pay

## 2022-07-14 DIAGNOSIS — R0789 Other chest pain: Secondary | ICD-10-CM

## 2022-07-14 NOTE — Telephone Encounter (Signed)
Called [facility] and they did advise to send a new referral regarding sternum since he is being seen specifically for his spine with [facility] at this time

## 2022-07-14 NOTE — Telephone Encounter (Signed)
Noted.  New referral ordered.

## 2022-07-16 ENCOUNTER — Other Ambulatory Visit: Payer: Self-pay

## 2022-07-16 ENCOUNTER — Other Ambulatory Visit (HOSPITAL_BASED_OUTPATIENT_CLINIC_OR_DEPARTMENT_OTHER): Payer: Self-pay

## 2022-08-30 ENCOUNTER — Other Ambulatory Visit (HOSPITAL_BASED_OUTPATIENT_CLINIC_OR_DEPARTMENT_OTHER): Payer: Self-pay

## 2022-08-30 MED ORDER — METHOCARBAMOL 500 MG PO TABS
500.0000 mg | ORAL_TABLET | Freq: Every day | ORAL | 1 refills | Status: DC
  Filled 2022-08-30: qty 30, 30d supply, fill #0
  Filled 2022-09-22: qty 30, 30d supply, fill #1

## 2022-08-30 MED ORDER — PREDNISONE 5 MG PO TABS
ORAL_TABLET | ORAL | 1 refills | Status: DC
  Filled 2022-08-30: qty 21, 6d supply, fill #0
  Filled 2022-10-13: qty 21, 6d supply, fill #1

## 2022-08-30 MED ORDER — GABAPENTIN 300 MG PO CAPS
300.0000 mg | ORAL_CAPSULE | Freq: Two times a day (BID) | ORAL | 2 refills | Status: DC | PRN
  Filled 2022-08-30: qty 60, 30d supply, fill #0
  Filled 2022-09-22: qty 60, 30d supply, fill #1
  Filled 2022-10-27: qty 60, 30d supply, fill #2

## 2022-10-27 ENCOUNTER — Other Ambulatory Visit (HOSPITAL_BASED_OUTPATIENT_CLINIC_OR_DEPARTMENT_OTHER): Payer: Self-pay

## 2022-10-27 MED ORDER — METHOCARBAMOL 500 MG PO TABS
500.0000 mg | ORAL_TABLET | Freq: Every day | ORAL | 1 refills | Status: DC
  Filled 2022-10-27: qty 30, 30d supply, fill #0
  Filled 2022-11-28: qty 30, 30d supply, fill #1

## 2022-11-28 ENCOUNTER — Other Ambulatory Visit (HOSPITAL_BASED_OUTPATIENT_CLINIC_OR_DEPARTMENT_OTHER): Payer: Self-pay

## 2022-11-29 ENCOUNTER — Other Ambulatory Visit (HOSPITAL_BASED_OUTPATIENT_CLINIC_OR_DEPARTMENT_OTHER): Payer: Self-pay

## 2022-11-29 ENCOUNTER — Other Ambulatory Visit: Payer: Self-pay

## 2022-11-29 MED ORDER — GABAPENTIN 300 MG PO CAPS
300.0000 mg | ORAL_CAPSULE | Freq: Two times a day (BID) | ORAL | 2 refills | Status: DC | PRN
  Filled 2022-11-29: qty 60, 30d supply, fill #0
  Filled 2022-12-27: qty 60, 30d supply, fill #1
  Filled 2023-01-26: qty 60, 30d supply, fill #2

## 2022-12-23 ENCOUNTER — Other Ambulatory Visit (HOSPITAL_BASED_OUTPATIENT_CLINIC_OR_DEPARTMENT_OTHER): Payer: Self-pay

## 2022-12-27 ENCOUNTER — Other Ambulatory Visit (HOSPITAL_BASED_OUTPATIENT_CLINIC_OR_DEPARTMENT_OTHER): Payer: Self-pay

## 2022-12-27 MED ORDER — METHOCARBAMOL 500 MG PO TABS
500.0000 mg | ORAL_TABLET | Freq: Every day | ORAL | 1 refills | Status: DC
  Filled 2022-12-27: qty 30, 30d supply, fill #0
  Filled 2023-01-26: qty 30, 30d supply, fill #1

## 2023-01-18 ENCOUNTER — Telehealth: Payer: Self-pay

## 2023-01-18 NOTE — Telephone Encounter (Signed)
Scheduled appointment for next week pt does not wish to wait

## 2023-01-18 NOTE — Telephone Encounter (Signed)
It appears he has a visit scheduled with me August 1.  We can shift that to a preop evaluation appointment if that would be soon enough.

## 2023-01-18 NOTE — Telephone Encounter (Signed)
Error

## 2023-01-18 NOTE — Telephone Encounter (Signed)
Pt brought a surgical clearance form by from [facility] . There is nothing on form as to if they need an EKG or lab work . I left a message for Madlyn Frankel at [facility] to verify what or if they needed anything so I can make the pt an pre op apt .  Surgery date is TBD .  Form placed in Dr Neva Seat bin up front

## 2023-01-26 ENCOUNTER — Ambulatory Visit: Payer: No Typology Code available for payment source | Admitting: Family Medicine

## 2023-01-26 ENCOUNTER — Encounter: Payer: Self-pay | Admitting: Family Medicine

## 2023-01-26 VITALS — BP 142/80 | HR 78 | Temp 98.4°F | Ht 73.0 in | Wt 228.2 lb

## 2023-01-26 DIAGNOSIS — Z01810 Encounter for preprocedural cardiovascular examination: Secondary | ICD-10-CM | POA: Diagnosis not present

## 2023-01-26 DIAGNOSIS — G8929 Other chronic pain: Secondary | ICD-10-CM

## 2023-01-26 DIAGNOSIS — M545 Low back pain, unspecified: Secondary | ICD-10-CM | POA: Diagnosis not present

## 2023-01-26 NOTE — Patient Instructions (Signed)
Thank you for coming in today.  I do not see any concerns on your EKG or with your history/exam that would put you at any increased risk of major adverse cardiac event or preclude you from planned surgery.  I will check electrolytes and your blood counts and if those look okay we will complete the paperwork for your surgeon.  Good luck.  Hope you get some improvement soon with that procedure.  Take care!

## 2023-01-26 NOTE — Progress Notes (Signed)
Subjective:  Patient ID: Walter Evans, male    DOB: 11-30-1980  Age: 42 y.o. MRN: [id number]  CC:  Chief Complaint  Patient presents with   Medical Clearance    Pt here for clearance, surgery will be scheduled once this clearance is complete, no cardiology     HPI Walter Evans presents for   Preoperative evaluation: History of chronic low back pain, chronic right knee pain, and lumbar disc protrusion(rupture of L4-5, small disc protrusion L5-S1.  Has been under care of [facility] - Dr. Ethelene Hal, Dr. Shon Baton.  Reports he had updated MRI indicating Lumbar spinal stenosis, treated with methocarbamol, gabapentin. Trial of spinal cord stimulator last month - worked well. 70-80% pain resolution. Plan for permanent spinal cord stimulator (replaced every 10 years)  - discussed at 01/18/23 visit with Dr. Shon Baton. Plan for general anesthesia. Plan for overnight stay for pain mgt.  Anesthesia for tonsillectomy in 2007 - no issues with anesthesia.   Chest pressure discussed in December 2023, suspected xiphoid contusion.  Radiating pain likely due to change in position or posture at that time.  Reassuring EKG at that time.  X-ray of xiphoid without acute cardiopulmonary findings, ventral curvature of the xiphoid process but no bone lesion or fracture.  Option to follow-up with Ortho if persistent discomfort at that time. Able to exercise at gym, planks - no cp/dyspnea., no difficulty with 2 flights of stairs.  No hx of OSA. MI, CHF, CVA, or CKD.  Positive responses on PHQ discussed.  Reports the symptoms are due to his chronic back pain,  declines any new treatment need or new medication needed at this time..  Reports the symptoms should improve with planned treatment of back pain.  RTC precautions given if any persistent depressed mood or symptoms once pain is controlled, agrees with plan.     01/26/2023    3:48 PM 06/30/2022    3:37 PM 01/19/2022    9:10 AM 10/14/2021    9:04 AM 05/19/2021    8:57  AM  Depression screen PHQ 2/9  Decreased Interest 1 0 0 1 0  Down, Depressed, Hopeless 1 1 0 0 0  PHQ - 2 Score 2 1 0 1 0  Altered sleeping 2 1 1 1 2   Tired, decreased energy 2 1 0 0 1  Change in appetite 2 0 0 0 1  Feeling bad or failure about yourself  1 0 0 0 0  Trouble concentrating 2 0 0 0 0  Moving slowly or fidgety/restless 1 0 1 0 0  Suicidal thoughts 0 0 0 0 0  PHQ-9 Score 12 3 2 2 4   Difficult doing work/chores   Not difficult at all Not difficult at all      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   Past Medical History:  Diagnosis Date   Allergy    Arthritis    RIGHT knee  and back   Back pain    L3-L4   HSV-2 (herpes simplex virus 2) infection    MRSA (methicillin resistant Staphylococcus aureus)    Neuromuscular disorder (HCC)    Psoriasis    Past Surgical History:  Procedure Laterality Date   KNEE ARTHROTOMY Right    TONSILLECTOMY  07/11/2005   WISDOM TOOTH EXTRACTION  07/11/2001   Allergies  Allergen Reactions   Other     Cannabinoids- seizure activity    Fish Oil     Allergic to all fish   Shellfish Allergy     unknown   Prior to Admission medications   Medication Sig Start Date End Date Taking? Authorizing Provider  gabapentin (NEURONTIN) 300 MG capsule Take 1 capsule by mouth every evening as needed. 06/17/22  Yes   gabapentin (NEURONTIN) 300 MG capsule Take 1  capsule (300 mg total) by mouth 2 (two) times daily as needed. 11/29/22  Yes   methocarbamol (ROBAXIN) 500 MG tablet Take 1 tablet (500 mg total) by mouth at bedtime. 12/27/22  Yes   predniSONE (DELTASONE) 5 MG tablet Take 6 tablets by mouth on day 1, then 5 tablets on day 2, then 4 tablets on day 3, then 3 tablets on day 4, then 2 tablets on day 5, then 1 tablet on day 6. 08/30/22  Yes   triamcinolone (KENALOG) 0.025 % cream Apply sparingly to affected areas. 06/30/22  Yes Shade Flood, MD  Triamcinolone Acetonide 0.025 % LOTN Apply sparingly to affected areas. 10/14/21  Yes Shade Flood, MD   Social History   Socioeconomic History   Marital status: Divorced    Spouse name: Carissa    Number of children: 0   Years of education: 12   Highest education level: Not on file  Occupational History   Occupation: Producer, television/film/video  Tobacco Use   Smoking status: Never   Smokeless tobacco: Never  Vaping Use   Vaping status: Never Used  Substance and Sexual Activity   Alcohol use: Yes    Alcohol/week: 0.0 - 2.0 standard drinks of alcohol   Drug use: Never   Sexual activity: Yes    Partners: Male, Male  Other Topics Concern   Not on file  Social History Narrative   Patient lives at home with wife [name].   Patient is ambidextrous    I'm a moderately active vegetarian. But I like [place]. But I drink a lot of water.   1 cup of coffee, 6-7 cups of tea/day.   Social Determinants of Health   Financial Resource Strain: Not on file  Food Insecurity: Not on file  Transportation Needs: Not on file  Physical Activity: Not on file  Stress: Not on file  Social Connections: Not on file  Intimate Partner Violence: Not on file    Review of Systems  Per HPI.  Objective:   Vitals:   01/26/23 1549  BP: (!) 142/80  Pulse: 78  Temp: 98.4 F (36.9 C)  TempSrc: Temporal  SpO2: 96%  Weight: 228 lb 3.2 oz (103.5 kg)  Height: 6\' 1"  (1.854 m)  Elevated BP noted - currently in  pain with back today.    Physical Exam Vitals reviewed.  Constitutional:      Appearance: He is well-developed.  HENT:     Head: Normocephalic and atraumatic.  Neck:     Vascular: No carotid bruit or JVD.  Cardiovascular:     Rate and Rhythm: Normal rate and regular rhythm.  Heart sounds: Normal heart sounds. No murmur heard. Pulmonary:     Effort: Pulmonary effort is normal.     Breath sounds: Normal breath sounds. No rales.  Musculoskeletal:     Right lower leg: No edema.     Left lower leg: No edema.  Skin:    General: Skin is warm and dry.  Neurological:     Mental Status: He is alert and oriented to person, place, and time.  Psychiatric:        Mood and Affect: Mood normal.       EKG: SR, rate 84, no acute findings or significant changes from 06/30/22.   Assessment & Plan:  Walter Evans is a 42 y.o. male . Encounter for pre-operative cardiovascular clearance - Plan: EKG 12-Lead, CBC, Basic metabolic panel  Chronic low back pain, unspecified back pain laterality, unspecified whether sciatica present Borderline elevated blood pressure, likely related to pain.  Also discussed elevated PHQ as above, pain related and declined need for additional treatment at this time, RTC precautions if persistent depressed symptoms once pain is controlled and can look at other pain treatment options if needed. Appears to be at acceptable risk for planned procedure, low risk for major adverse cardiac event per RCRI criteria and metabolic equivalents above without cardiac symptoms.  No acute findings on EKG.  Check CBC, BMP, and if no concerns we will complete paperwork for surgeon.  No orders of the defined types were placed in this encounter.  Patient Instructions  Thank you for coming in today.  I do not see any concerns on your EKG or with your history/exam that would put you at any increased risk of major adverse cardiac event or preclude you from planned surgery.  I will check  electrolytes and your blood counts and if those look okay we will complete the paperwork for your surgeon.  Good luck.  Hope you get some improvement soon with that procedure.  Take care!    Signed,   Meredith Staggers, MD [facility], [facility] 01/26/23 4:52 PM

## 2023-01-27 ENCOUNTER — Other Ambulatory Visit (HOSPITAL_BASED_OUTPATIENT_CLINIC_OR_DEPARTMENT_OTHER): Payer: Self-pay

## 2023-01-27 LAB — BASIC METABOLIC PANEL
BUN: 10 mg/dL (ref 6–23)
CO2: 28 mEq/L (ref 19–32)
Calcium: 9.8 mg/dL (ref 8.4–10.5)
Chloride: 103 mEq/L (ref 96–112)
Creatinine, Ser: 1.01 mg/dL (ref 0.40–1.50)
GFR: 93.5 mL/min (ref >60.00)
Glucose, Bld: 105 mg/dL — ABNORMAL HIGH (ref 70–99)
Potassium: 4.1 mEq/L (ref 3.5–5.1)
Sodium: 139 mEq/L (ref 135–145)

## 2023-01-27 LAB — CBC
HCT: 45.9 % (ref 39.0–52.0)
Hemoglobin: 15.3 g/dL (ref 13.0–17.0)
MCHC: 33.3 g/dL (ref 30.0–36.0)
MCV: 84.2 fl (ref 78.0–100.0)
Platelets: 339 10*3/uL (ref 150.0–400.0)
RBC: 5.45 Mil/uL (ref 4.22–5.81)
RDW: 13.2 % (ref 11.5–15.5)
WBC: 6.5 10*3/uL (ref 4.0–10.5)

## 2023-01-31 ENCOUNTER — Telehealth: Payer: Self-pay | Admitting: Family Medicine

## 2023-01-31 NOTE — Telephone Encounter (Signed)
Preoperative paperwork completed for [facility],  completed and placed in fax bin at back nurse station

## 2023-01-31 NOTE — Telephone Encounter (Signed)
Faxed to the ortho and called patient to inform him it was completed

## 2023-02-09 ENCOUNTER — Encounter: Payer: Self-pay | Admitting: Family Medicine

## 2023-02-09 ENCOUNTER — Ambulatory Visit: Payer: No Typology Code available for payment source | Admitting: Family Medicine

## 2023-02-09 ENCOUNTER — Other Ambulatory Visit (HOSPITAL_BASED_OUTPATIENT_CLINIC_OR_DEPARTMENT_OTHER): Payer: Self-pay

## 2023-02-09 VITALS — BP 128/78 | HR 76 | Temp 98.4°F

## 2023-02-09 DIAGNOSIS — R739 Hyperglycemia, unspecified: Secondary | ICD-10-CM | POA: Diagnosis not present

## 2023-02-09 DIAGNOSIS — L409 Psoriasis, unspecified: Secondary | ICD-10-CM

## 2023-02-09 DIAGNOSIS — Z0000 Encounter for general adult medical examination without abnormal findings: Secondary | ICD-10-CM

## 2023-02-09 DIAGNOSIS — E785 Hyperlipidemia, unspecified: Secondary | ICD-10-CM | POA: Diagnosis not present

## 2023-02-09 DIAGNOSIS — Z131 Encounter for screening for diabetes mellitus: Secondary | ICD-10-CM | POA: Diagnosis not present

## 2023-02-09 LAB — HEPATIC FUNCTION PANEL
ALT: 18 U/L (ref 0–53)
AST: 18 U/L (ref 0–37)
Albumin: 5 g/dL (ref 3.5–5.2)
Alkaline Phosphatase: 87 U/L (ref 39–117)
Bilirubin, Direct: 0.1 mg/dL (ref 0.0–0.3)
Total Bilirubin: 0.4 mg/dL (ref 0.2–1.2)
Total Protein: 7.5 g/dL (ref 6.0–8.3)

## 2023-02-09 LAB — LIPID PANEL
Cholesterol: 320 mg/dL — ABNORMAL HIGH (ref 0–200)
HDL: 44.2 mg/dL (ref >39.00)
Total CHOL/HDL Ratio: 7
Triglycerides: 436 mg/dL — ABNORMAL HIGH (ref 0.0–149.0)

## 2023-02-09 LAB — LDL CHOLESTEROL, DIRECT: Direct LDL: 188 mg/dL

## 2023-02-09 LAB — HEMOGLOBIN A1C: Hgb A1c MFr Bld: 5.6 % (ref 4.6–6.5)

## 2023-02-09 MED ORDER — TRIAMCINOLONE ACETONIDE 0.025 % EX LOTN
TOPICAL_LOTION | CUTANEOUS | 2 refills | Status: DC
Start: 2023-02-09 — End: 2024-02-21
  Filled 2023-02-09: qty 60, 30d supply, fill #0

## 2023-02-09 MED ORDER — TRIAMCINOLONE ACETONIDE 0.025 % EX CREA
TOPICAL_CREAM | CUTANEOUS | 2 refills | Status: DC
  Filled 2023-02-09: qty 30, 30d supply, fill #0

## 2023-02-09 NOTE — Patient Instructions (Signed)
No medication changes at this time.  Good luck with the procedure at the end of the month.  If any persistent mood symptoms after treatment of the back pain I would like to discuss those further.  Refilled the cream and lotion for psoriasis.  Blood sugar up a few points last time but not concerning as you were not fasting.  Will check some labs today including a 45-month average.  Follow-up in 6 months but let me know if you have any questions in the meantime.  Take care.   Preventive Care 77-67 Years Old, Male Preventive care refers to lifestyle choices and visits with your health care provider that can promote health and wellness. Preventive care visits are also called wellness exams. What can I expect for my preventive care visit? Counseling During your preventive care visit, your health care provider may ask about your: Medical history, including: Past medical problems. Family medical history. Current health, including: Emotional well-being. Home life and relationship well-being. Sexual activity. Lifestyle, including: Alcohol, nicotine or tobacco, and drug use. Access to firearms. Diet, exercise, and sleep habits. Safety issues such as seatbelt and bike helmet use. Sunscreen use. Work and work Astronomer. Physical exam Your health care provider may check your: Height and weight. These may be used to calculate your BMI (body mass index). BMI is a measurement that tells if you are at a healthy weight. Waist circumference. This measures the distance around your waistline. This measurement also tells if you are at a healthy weight and may help predict your risk of certain diseases, such as type 2 diabetes and high blood pressure. Heart rate and blood pressure. Body temperature. Skin for abnormal spots. What immunizations do I need?  Vaccines are usually given at various ages, according to a schedule. Your health care provider will recommend vaccines for you based on your age, medical  history, and lifestyle or other factors, such as travel or where you work. What tests do I need? Screening Your health care provider may recommend screening tests for certain conditions. This may include: Lipid and cholesterol levels. Diabetes screening. This is done by checking your blood sugar (glucose) after you have not eaten for a while (fasting). Hepatitis B test. Hepatitis C test. HIV (human immunodeficiency virus) test. STI (sexually transmitted infection) testing, if you are at risk. Talk with your health care provider about your test results, treatment options, and if necessary, the need for more tests. Follow these instructions at home: Eating and drinking  Eat a healthy diet that includes fresh fruits and vegetables, whole grains, lean protein, and low-fat dairy products. Drink enough fluid to keep your urine pale yellow. Take vitamin and mineral supplements as recommended by your health care provider. Do not drink alcohol if your health care provider tells you not to drink. If you drink alcohol: Limit how much you have to 0-2 drinks a day. Know how much alcohol is in your drink. In the [place] one drink equals one 12 oz bottle of beer (355 mL), one 5 oz glass of wine (148 mL), or one 1 oz glass of hard liquor (44 mL). Lifestyle Brush your teeth every morning and night with fluoride toothpaste. Floss one time each day. Exercise for at least 30 minutes 5 or more days each week. Do not use any products that contain nicotine or tobacco. These products include cigarettes, chewing tobacco, and vaping devices, such as e-cigarettes. If you need help quitting, ask your health care provider. Do not use drugs. If you  are sexually active, practice safe sex. Use a condom or other form of protection to prevent STIs. Find healthy ways to manage stress, such as: Meditation, yoga, or listening to music. Journaling. Talking to a trusted person. Spending time with friends and  family. Minimize exposure to UV radiation to reduce your risk of skin cancer. Safety Always wear your seat belt while driving or riding in a vehicle. Do not drive: If you have been drinking alcohol. Do not ride with someone who has been drinking. If you have been using any mind-altering substances or drugs. While texting. When you are tired or distracted. Wear a helmet and other protective equipment during sports activities. If you have firearms in your house, make sure you follow all gun safety procedures. Seek help if you have been physically or sexually abused. What's next? Go to your health care provider once a year for an annual wellness visit. Ask your health care provider how often you should have your eyes and teeth checked. Stay up to date on all vaccines. This information is not intended to replace advice given to you by your health care provider. Make sure you discuss any questions you have with your health care provider. Document Revised: 12/23/2020 Document Reviewed: 12/23/2020 Elsevier Patient Education  2024 [employer].

## 2023-02-09 NOTE — Progress Notes (Signed)
Subjective:  Patient ID: Walter Evans, male    DOB: [birth year]  Age: 42 y.o. MRN: [id number]  CC:  Chief Complaint  Patient presents with   Annual Exam    Pt doing okay today     HPI Walter Evans presents for Annual Exam  Presents for physical.  Recent preop eval on July 18, chronic low back pain treated with gabapentin, methocarbamol.  Plan for Spinal cord stimulator.  Followed by [facility]. CBC, BMP normal at that time.  Depression screening discussed previously, symptoms are due to back pain, anticipates improvement once that pain is managed.  Declined any new treatment need or new medications for the symptoms. Pain is a little better. No SI or change in mood symptoms. Declines cymbalta or other new meds at this time.  Plan for spinal cord stimulator end of next month.   Hyperlipidemia: No FH of early heart disease. Multiple family members with HLD, but statins have not been beneficial.  No current meds.  The ASCVD Risk score (Arnett DK, et al., 2019) failed to calculate for the following reasons:   The 2019 ASCVD risk score is only valid for ages 30 to 75 Nonfasting glucose 105.  Fasting today.  Lab Results  Component Value Date   CHOL 271 (H) 01/19/2022   HDL 47.20 01/19/2022   LDLCALC 153 (H) 10/23/2019   LDLDIRECT 181.0 01/19/2022   TRIG 239.0 (H) 01/19/2022   CHOLHDL 6 01/19/2022   Lab Results  Component Value Date   ALT 17 01/19/2022   AST 20 01/19/2022   ALKPHOS 84 01/19/2022   BILITOT 0.6 01/19/2022       01/26/2023    3:48 PM 06/30/2022    3:37 PM 01/19/2022    9:10 AM 10/14/2021    9:04 AM 05/19/2021    8:57 AM  Depression screen PHQ 2/9  Decreased Interest 1 0 0 1 0  Down, Depressed, Hopeless 1 1 0 0 0  PHQ - 2 Score 2 1 0 1 0  Altered sleeping 2 1 1 1 2   Tired, decreased energy 2 1 0 0 1  Change in appetite 2 0 0 0 1  Feeling bad or failure about yourself  1 0 0 0 0  Trouble concentrating 2 0 0 0 0  Moving slowly or fidgety/restless 1 0 1 0  0  Suicidal thoughts 0 0 0 0 0  PHQ-9 Score 12 3 2 2 4   Difficult doing work/chores   Not difficult at all Not difficult at all    Health Maintenance  Topic Date Due   INFLUENZA VACCINE  02/09/2023   Colonoscopy  01/13/2026   DTaP/Tdap/Td (2 - Td or Tdap) 02/11/2026   COVID-19 Vaccine  Completed   Hepatitis C Screening  Completed   HIV Screening  Completed   HPV VACCINES  Aged Out  FH of colon CA, had colonoscopy in 01/2021. Repeat 3-5 yrs, plans repeat next year. Dr. Russella Dar.  Genetic counselor eval last year. No other recommendations at this time.    Immunization History  Administered Date(s) Administered   COVID-19, mRNA, vaccine(Comirnaty)12 years and older 04/22/2022   Hepatitis A, Adult 08/02/2016, 03/16/2017   Meningococcal Mcv4o 08/02/2016   Pfizer Covid-19 Vaccine Bivalent Booster 19yrs & up 04/14/2021   Tdap 02/12/2016  New covid booster and flu vaccine recommended in fall.   No results found. Optho -eval few years ago. Vision doing ok.   Dental: walk-in dental clinic - last year. Trying to find dentist  without a television in room.   Alcohol: none  Tobacco: none  Exercise: limited by back pain currently, some increase with slight improved back pain - able to do planks.   Declines sti screening.   Needs refill of TAC cream (skin) and lotion (scalp/eyebrows) for psoriasis - working well.                  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   Past Medical History:  Diagnosis Date   Allergy    Arthritis    RIGHT knee and back   Back pain    L3-L4   HSV-2 (herpes simplex virus 2) infection    MRSA (methicillin resistant Staphylococcus aureus)    Neuromuscular disorder (HCC)    Psoriasis    Past Surgical History:  Procedure Laterality Date   KNEE ARTHROTOMY Right    TONSILLECTOMY  07/11/2005   WISDOM TOOTH EXTRACTION  07/11/2001   Allergies  Allergen Reactions   Other     Cannabinoids- seizure activity    Fish Oil     Allergic to all fish   Shellfish Allergy     unknown   Prior to Admission medications   Medication Sig Start Date End Date Taking? Authorizing Provider  gabapentin (NEURONTIN) 300 MG capsule Take 1 capsule by mouth every evening as needed. 06/17/22  Yes   gabapentin (NEURONTIN) 300 MG capsule Take 1 capsule (300 mg total) by mouth 2 (two) times daily as needed. 11/29/22  Yes   methocarbamol (ROBAXIN) 500 MG tablet Take 1 tablet (500 mg total) by mouth at bedtime. 12/27/22  Yes   predniSONE (DELTASONE) 5 MG tablet Take 6 tablets by mouth on day 1, then 5 tablets on day 2, then 4 tablets on day 3, then 3 tablets on day 4, then 2 tablets on day 5, then 1 tablet on day 6. 08/30/22  Yes   triamcinolone (KENALOG) 0.025 % cream Apply sparingly to affected areas. 06/30/22  Yes Shade Flood, MD  Triamcinolone Acetonide 0.025 % LOTN Apply sparingly to affected areas. 10/14/21  Yes Shade Flood, MD   Social History   Socioeconomic History   Marital status: Divorced    Spouse name: Carissa    Number of children: 0   Years of education: 12   Highest education level: Not on file  Occupational History   Occupation: Producer, television/film/video  Tobacco Use   Smoking status: Never   Smokeless tobacco: Never  Vaping Use   Vaping status: Never Used  Substance and Sexual Activity   Alcohol use: Yes    Alcohol/week: 0.0 - 2.0 standard drinks of  alcohol   Drug use: Never   Sexual activity: Yes    Partners: Male, Male  Other Topics Concern   Not on file  Social History Narrative   Patient lives at home with wife [name].   Patient is ambidextrous  I'm a moderately active vegetarian. But I like [place]. But I drink a lot of water.   1 cup of coffee, 6-7 cups of tea/day.   Social Determinants of Health   Financial Resource Strain: Not on file  Food Insecurity: Not on file  Transportation Needs: Not on file  Physical Activity: Not on file  Stress: Not on file  Social Connections: Not on file  Intimate Partner Violence: Not on file    Review of Systems 13 point review of systems per patient health survey noted.  Negative other than as indicated above or in HPI.    Objective:   Vitals:   02/09/23 0932  BP: 128/78  Pulse: 76  Temp: 98.4 F (36.9 C)  TempSrc: Temporal  SpO2: 98%     Physical Exam Vitals reviewed.  Constitutional:      Appearance: He is well-developed.  HENT:     Head: Normocephalic and atraumatic.     Right Ear: External ear normal.     Left Ear: External ear normal.  Eyes:     Conjunctiva/sclera: Conjunctivae normal.     Pupils: Pupils are equal, round, and reactive to light.  Neck:     Thyroid: No thyromegaly.  Cardiovascular:     Rate and Rhythm: Normal rate and regular rhythm.     Heart sounds: Normal heart sounds.  Pulmonary:     Effort: Pulmonary effort is normal. No respiratory distress.     Breath sounds: Normal breath sounds. No wheezing.  Abdominal:     General: There is no distension.     Palpations: Abdomen is soft.     Tenderness: There is no abdominal tenderness.  Musculoskeletal:        General: No tenderness. Normal range of motion.     Cervical back: Normal range of motion and neck supple.  Lymphadenopathy:     Cervical: No cervical adenopathy.  Skin:    General: Skin is warm and dry.  Neurological:     Mental Status: He is alert and oriented to person,  place, and time.     Deep Tendon Reflexes: Reflexes are normal and symmetric.  Psychiatric:        Behavior: Behavior normal.        Assessment & Plan:  DAVIDLEE BEARE is a 42 y.o. male . Annual physical exam  - -anticipatory guidance as below in AVS, screening labs above. Health maintenance items as above in HPI discussed/recommended as applicable.   Psoriasis - Plan: triamcinolone (KENALOG) 0.025 % cream, Triamcinolone Acetonide 0.025 % LOTN  -Stable with triamcinolone lotion, cream depending on area of use as above.  Refilled.  Hyperglycemia - Plan: Hemoglobin A1c Screening for diabetes mellitus - Plan: Hemoglobin A1c  -Mild hyperglycemia previously, nonfasting at that time.  Check A1c.  Hyperlipidemia, unspecified hyperlipidemia type - Plan: Lipid panel, Hepatic function panel  -Check labs, anticipate increased activity/exercise once back pain improved.  Some exercise as above with recent slight improvement.  Continue to watch diet.  ASCVD risk scoring at age 89.  No new meds at this time.   Meds ordered this encounter  Medications   triamcinolone (KENALOG) 0.025 % cream    Sig: Apply sparingly to affected areas.    Dispense:  30 g    Refill:  2   Triamcinolone Acetonide 0.025 % LOTN    Sig: Apply sparingly to affected areas.    Dispense:  60 mL    Refill:  2   Patient Instructions  No medication changes at this time.  Good luck with the procedure at the end of the month.  If any persistent mood symptoms after treatment of the back pain I would like to discuss those further.  Refilled the cream and lotion for psoriasis.  Blood sugar up a few points last time but not concerning as you were not fasting.  Will check some labs today including a 44-month average.  Follow-up in 6 months but let me know if you have any questions in the meantime.  Take care.   Preventive Care 61-76 Years Old, Male Preventive care refers to lifestyle choices and visits with your health care  provider that can promote health and wellness. Preventive care visits are also called wellness exams. What can I expect for my preventive care visit? Counseling During your preventive care visit, your health care provider may ask about your: Medical history, including: Past medical problems. Family medical history. Current health, including: Emotional well-being. Home life and relationship well-being. Sexual activity. Lifestyle, including: Alcohol, nicotine or tobacco, and drug use. Access to firearms. Diet, exercise, and sleep habits. Safety issues such as seatbelt and bike helmet use. Sunscreen use. Work and work Astronomer. Physical exam Your health care provider may check your: Height and weight. These may be used to calculate your BMI (body mass index). BMI is a measurement that tells if you are at a healthy weight. Waist circumference. This measures the distance around your waistline. This measurement also tells if you are at a healthy weight and may help predict your risk of certain diseases, such as type 2 diabetes and high blood pressure. Heart rate and blood pressure. Body temperature. Skin for abnormal spots. What immunizations do I need?  Vaccines are usually given at various ages, according to a schedule. Your health care provider will recommend vaccines for you based on your age, medical history, and lifestyle or other factors, such as travel or where you work. What tests do I need? Screening Your health care provider may recommend screening tests for certain conditions. This may include: Lipid and cholesterol levels. Diabetes screening. This is done by checking your blood sugar (glucose) after you have not eaten for a while (fasting). Hepatitis B test. Hepatitis C test. HIV (human immunodeficiency virus) test. STI (sexually transmitted infection) testing, if you are at risk. Talk with your health care provider about your test results, treatment options, and if  necessary, the need for more tests. Follow these instructions at home: Eating and drinking  Eat a healthy diet that includes fresh fruits and vegetables, whole grains, lean protein, and low-fat dairy products. Drink enough fluid to keep your urine pale yellow. Take vitamin and mineral supplements as recommended by your health care provider. Do not drink alcohol if your health care provider tells you not to drink. If you drink alcohol: Limit how much you have to 0-2 drinks a day. Know how much alcohol is in your drink. In the [place] one drink equals one 12 oz bottle of beer (355 mL), one 5 oz glass of wine (148 mL), or one 1 oz glass of hard liquor (44 mL). Lifestyle Brush your teeth every morning and night with fluoride toothpaste. Floss one time each day. Exercise for at least 30 minutes 5 or more days each week. Do not use any products that contain nicotine or tobacco. These products include cigarettes, chewing tobacco, and vaping devices, such as e-cigarettes. If you need help quitting, ask your health care provider. Do not use drugs. If  you are sexually active, practice safe sex. Use a condom or other form of protection to prevent STIs. Find healthy ways to manage stress, such as: Meditation, yoga, or listening to music. Journaling. Talking to a trusted person. Spending time with friends and family. Minimize exposure to UV radiation to reduce your risk of skin cancer. Safety Always wear your seat belt while driving or riding in a vehicle. Do not drive: If you have been drinking alcohol. Do not ride with someone who has been drinking. If you have been using any mind-altering substances or drugs. While texting. When you are tired or distracted. Wear a helmet and other protective equipment during sports activities. If you have firearms in your house, make sure you follow all gun safety procedures. Seek help if you have been physically or sexually abused. What's next? Go to your  health care provider once a year for an annual wellness visit. Ask your health care provider how often you should have your eyes and teeth checked. Stay up to date on all vaccines. This information is not intended to replace advice given to you by your health care provider. Make sure you discuss any questions you have with your health care provider. Document Revised: 12/23/2020 Document Reviewed: 12/23/2020 Elsevier Patient Education  2024 [employer]     Signed,   Meredith Staggers, MD [facility], [facility] 02/09/23 10:34 AM

## 2023-02-22 ENCOUNTER — Other Ambulatory Visit (HOSPITAL_BASED_OUTPATIENT_CLINIC_OR_DEPARTMENT_OTHER): Payer: Self-pay

## 2023-02-23 ENCOUNTER — Other Ambulatory Visit (HOSPITAL_BASED_OUTPATIENT_CLINIC_OR_DEPARTMENT_OTHER): Payer: Self-pay

## 2023-02-23 MED ORDER — GABAPENTIN 300 MG PO CAPS
300.0000 mg | ORAL_CAPSULE | Freq: Two times a day (BID) | ORAL | 2 refills | Status: DC | PRN
  Filled 2023-02-23: qty 60, 30d supply, fill #0
  Filled 2023-03-29: qty 60, 30d supply, fill #1
  Filled 2023-04-30: qty 60, 30d supply, fill #2

## 2023-02-23 MED ORDER — METHOCARBAMOL 500 MG PO TABS
500.0000 mg | ORAL_TABLET | Freq: Every day | ORAL | 1 refills | Status: DC
  Filled 2023-02-23: qty 30, 30d supply, fill #0
  Filled 2023-03-29: qty 30, 30d supply, fill #1

## 2023-03-10 ENCOUNTER — Other Ambulatory Visit (HOSPITAL_BASED_OUTPATIENT_CLINIC_OR_DEPARTMENT_OTHER): Payer: Self-pay

## 2023-03-10 MED ORDER — METHOCARBAMOL 500 MG PO TABS
500.0000 mg | ORAL_TABLET | Freq: Three times a day (TID) | ORAL | 0 refills | Status: DC
  Filled 2023-03-10: qty 15, 5d supply, fill #0

## 2023-03-10 MED ORDER — OXYCODONE-ACETAMINOPHEN 10-325 MG PO TABS
1.0000 | ORAL_TABLET | Freq: Four times a day (QID) | ORAL | 0 refills | Status: DC | PRN
  Filled 2023-03-10: qty 20, 5d supply, fill #0

## 2023-03-10 MED ORDER — ONDANSETRON HCL 4 MG PO TABS
4.0000 mg | ORAL_TABLET | Freq: Three times a day (TID) | ORAL | 0 refills | Status: DC | PRN
  Filled 2023-03-10: qty 30, 10d supply, fill #0

## 2023-03-14 ENCOUNTER — Other Ambulatory Visit (HOSPITAL_BASED_OUTPATIENT_CLINIC_OR_DEPARTMENT_OTHER): Payer: Self-pay

## 2023-03-14 MED ORDER — METHOCARBAMOL 500 MG PO TABS
500.0000 mg | ORAL_TABLET | Freq: Three times a day (TID) | ORAL | 0 refills | Status: AC | PRN
  Filled 2023-03-14: qty 21, 7d supply, fill #0

## 2023-03-14 MED ORDER — OXYCODONE-ACETAMINOPHEN 10-325 MG PO TABS
1.0000 | ORAL_TABLET | Freq: Three times a day (TID) | ORAL | 0 refills | Status: AC | PRN
  Filled 2023-03-14: qty 21, 7d supply, fill #0

## 2023-03-15 ENCOUNTER — Other Ambulatory Visit (HOSPITAL_BASED_OUTPATIENT_CLINIC_OR_DEPARTMENT_OTHER): Payer: Self-pay

## 2023-03-29 ENCOUNTER — Other Ambulatory Visit (HOSPITAL_BASED_OUTPATIENT_CLINIC_OR_DEPARTMENT_OTHER): Payer: Self-pay

## 2023-03-30 ENCOUNTER — Other Ambulatory Visit (HOSPITAL_BASED_OUTPATIENT_CLINIC_OR_DEPARTMENT_OTHER): Payer: Self-pay

## 2023-03-30 MED ORDER — INFLUENZA VIRUS VACC SPLIT PF (FLUZONE) 0.5 ML IM SUSY
0.5000 mL | PREFILLED_SYRINGE | Freq: Once | INTRAMUSCULAR | 0 refills | Status: AC
  Filled 2023-03-30: qty 0.5, 1d supply, fill #0

## 2023-03-30 MED ORDER — COVID-19 MRNA VAC-TRIS(PFIZER) 30 MCG/0.3ML IM SUSY
0.3000 mL | PREFILLED_SYRINGE | Freq: Once | INTRAMUSCULAR | 0 refills | Status: AC
  Filled 2023-03-30: qty 0.3, 1d supply, fill #0

## 2023-04-30 ENCOUNTER — Other Ambulatory Visit (HOSPITAL_BASED_OUTPATIENT_CLINIC_OR_DEPARTMENT_OTHER): Payer: Self-pay

## 2023-05-01 ENCOUNTER — Other Ambulatory Visit (HOSPITAL_BASED_OUTPATIENT_CLINIC_OR_DEPARTMENT_OTHER): Payer: Self-pay

## 2023-05-01 MED ORDER — METHOCARBAMOL 500 MG PO TABS
500.0000 mg | ORAL_TABLET | Freq: Every day | ORAL | 1 refills | Status: DC
  Filled 2023-05-01: qty 30, 30d supply, fill #0
  Filled 2023-05-25: qty 30, 30d supply, fill #1

## 2023-05-25 ENCOUNTER — Other Ambulatory Visit (HOSPITAL_BASED_OUTPATIENT_CLINIC_OR_DEPARTMENT_OTHER): Payer: Self-pay

## 2023-05-25 MED ORDER — AMOXICILLIN-POT CLAVULANATE 875-125 MG PO TABS
1.0000 | ORAL_TABLET | Freq: Two times a day (BID) | ORAL | 0 refills | Status: DC
  Filled 2023-05-25: qty 10, 5d supply, fill #0

## 2023-05-25 MED ORDER — GABAPENTIN 300 MG PO CAPS
300.0000 mg | ORAL_CAPSULE | Freq: Two times a day (BID) | ORAL | 2 refills | Status: DC | PRN
  Filled 2023-05-29: qty 60, 30d supply, fill #0
  Filled 2023-06-27: qty 60, 30d supply, fill #1
  Filled 2023-07-31: qty 60, 30d supply, fill #2

## 2023-05-29 ENCOUNTER — Other Ambulatory Visit (HOSPITAL_BASED_OUTPATIENT_CLINIC_OR_DEPARTMENT_OTHER): Payer: Self-pay

## 2023-06-27 ENCOUNTER — Other Ambulatory Visit (HOSPITAL_BASED_OUTPATIENT_CLINIC_OR_DEPARTMENT_OTHER): Payer: Self-pay

## 2023-06-28 ENCOUNTER — Other Ambulatory Visit (HOSPITAL_BASED_OUTPATIENT_CLINIC_OR_DEPARTMENT_OTHER): Payer: Self-pay

## 2023-06-28 MED ORDER — METHOCARBAMOL 500 MG PO TABS
500.0000 mg | ORAL_TABLET | Freq: Every day | ORAL | 1 refills | Status: DC
  Filled 2023-06-28: qty 30, 30d supply, fill #0
  Filled 2023-07-31: qty 30, 30d supply, fill #1

## 2023-06-28 MED ORDER — LORAZEPAM 1 MG PO TABS
1.0000 mg | ORAL_TABLET | Freq: Every day | ORAL | 0 refills | Status: DC
  Filled 2023-06-28: qty 4, 4d supply, fill #0

## 2023-07-31 ENCOUNTER — Other Ambulatory Visit (HOSPITAL_BASED_OUTPATIENT_CLINIC_OR_DEPARTMENT_OTHER): Payer: Self-pay

## 2023-08-14 ENCOUNTER — Ambulatory Visit: Payer: No Typology Code available for payment source | Admitting: Family Medicine

## 2023-08-14 VITALS — BP 122/86 | HR 78 | Temp 98.4°F | Wt 219.0 lb

## 2023-08-14 DIAGNOSIS — R739 Hyperglycemia, unspecified: Secondary | ICD-10-CM | POA: Diagnosis not present

## 2023-08-14 DIAGNOSIS — E785 Hyperlipidemia, unspecified: Secondary | ICD-10-CM | POA: Diagnosis not present

## 2023-08-14 DIAGNOSIS — Z131 Encounter for screening for diabetes mellitus: Secondary | ICD-10-CM

## 2023-08-14 DIAGNOSIS — Z1211 Encounter for screening for malignant neoplasm of colon: Secondary | ICD-10-CM

## 2023-08-14 DIAGNOSIS — Z800 Family history of malignant neoplasm of digestive organs: Secondary | ICD-10-CM | POA: Diagnosis not present

## 2023-08-14 DIAGNOSIS — Z860100 Personal history of colon polyps, unspecified: Secondary | ICD-10-CM | POA: Diagnosis not present

## 2023-08-14 LAB — LIPID PANEL
Cholesterol: 287 mg/dL — ABNORMAL HIGH (ref 0–200)
HDL: 45.4 mg/dL (ref >39.00)
LDL Cholesterol: 187 mg/dL — ABNORMAL HIGH (ref 0–99)
NonHDL: 241.28
Total CHOL/HDL Ratio: 6
Triglycerides: 271 mg/dL — ABNORMAL HIGH (ref 0.0–149.0)
VLDL: 54.2 mg/dL — ABNORMAL HIGH (ref 0.0–40.0)

## 2023-08-14 LAB — COMPREHENSIVE METABOLIC PANEL
ALT: 14 U/L (ref 0–53)
AST: 15 U/L (ref 0–37)
Albumin: 4.8 g/dL (ref 3.5–5.2)
Alkaline Phosphatase: 98 U/L (ref 39–117)
BUN: 12 mg/dL (ref 6–23)
CO2: 28 meq/L (ref 19–32)
Calcium: 9.8 mg/dL (ref 8.4–10.5)
Chloride: 102 meq/L (ref 96–112)
Creatinine, Ser: 0.91 mg/dL (ref 0.40–1.50)
GFR: 105.56 mL/min (ref >60.00)
Glucose, Bld: 93 mg/dL (ref 70–99)
Potassium: 5 meq/L (ref 3.5–5.1)
Sodium: 141 meq/L (ref 135–145)
Total Bilirubin: 0.6 mg/dL (ref 0.2–1.2)
Total Protein: 7.4 g/dL (ref 6.0–8.3)

## 2023-08-14 LAB — HEMOGLOBIN A1C: Hgb A1c MFr Bld: 5.7 % (ref 4.6–6.5)

## 2023-08-14 NOTE — Progress Notes (Signed)
Subjective:  Patient ID: Walter Evans, male    DOB: April 09, 1981  Age: 43 y.o. MRN: [id number]  CC:  Chief Complaint  Patient presents with   Medical Management of Chronic Issues    Pt is doing well, no questions     HPI Walter Evans presents for   Follow-up of chronic conditions, no specific concerns.   Hyperlipidemia: No family history of early heart disease.  Multiple family members with hyperlipidemia we discussed previously but statins have not been beneficial for other family members.  No current meds.  Previously unable to calculate ASCVD risk score given age.  Based on prior labs, current score below fairly low.  Fasting today.   The 10-year ASCVD risk score (Arnett DK, et al., 2019) is: 3.1%   Values used to calculate the score:     Age: 43 years     Sex: Male     Is Non-Hispanic African American: No     Diabetic: No     Tobacco smoker: No     Systolic Blood Pressure: 122 mmHg     Is BP treated: No     HDL Cholesterol: 44.2 mg/dL     Total Cholesterol: 320 mg/dL  Lab Results  Component Value Date   CHOL 320 (H) 02/09/2023   HDL 44.20 02/09/2023   LDLCALC 153 (H) 10/23/2019   LDLDIRECT 188.0 02/09/2023   TRIG (H) 02/09/2023    436.0 Triglyceride is over 400; calculations on Lipids are invalid.   CHOLHDL 7 02/09/2023   Lab Results  Component Value Date   ALT 18 02/09/2023   AST 18 02/09/2023   ALKPHOS 87 02/09/2023   BILITOT 0.4 02/09/2023   Psoriasis Treated with triamcinolone cream for the skin and triamcinolone lotion for scalp and eyebrows which has worked well previously.  Hyperglycemia Borderline A1c prior.  Glucose 105 last visit. Weight has improved.minimal home scale changes.  Wt Readings from Last 3 Encounters:  08/14/23 219 lb (99.3 kg)  01/26/23 228 lb 3.2 oz (103.5 kg)  06/30/22 232 lb 12.8 oz (105.6 kg)   Lab Results  Component Value Date   HGBA1C 5.6 02/09/2023   Found a dentist that is a good fit.  Colonoscopy in 2022.  Repeat 3-5 yrs. FH of colon CA in mother and grandmother. Mother with hemicolectomy.  Repeat 5 years per Dr. Russella Dar. 1 precancerous polyp.   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   Past Medical History:  Diagnosis Date   Allergy    Arthritis    RIGHT knee and back   Back pain    L3-L4   HSV-2 (herpes simplex virus 2) infection    MRSA (methicillin resistant Staphylococcus aureus)    Neuromuscular disorder (HCC)    Psoriasis    Past Surgical History:  Procedure Laterality Date   KNEE ARTHROTOMY Right    TONSILLECTOMY  07/11/2005   WISDOM TOOTH EXTRACTION  07/11/2001   Allergies  Allergen Reactions   Other     Cannabinoids- seizure activity    Fish Oil     Allergic to all fish   Shellfish Allergy     unknown   Prior to Admission medications   Medication Sig Start Date End Date Taking? Authorizing Provider  gabapentin (NEURONTIN) 300 MG capsule Take 1 capsule (300 mg total) by mouth 2 (two) times daily as needed. 05/25/23  Yes   methocarbamol (ROBAXIN) 500 MG tablet Take 1 tablet (500 mg total) by mouth at bedtime. 06/28/23  Yes   triamcinolone (KENALOG) 0.025 % cream Apply sparingly to affected areas. 02/09/23  Yes Walter Flood, MD  Triamcinolone Acetonide 0.025 % LOTN Apply sparingly to affected areas. 02/09/23  Yes Walter Flood, MD   Social History   Socioeconomic History   Marital  status: Divorced    Spouse name: Walter Evans    Number of children: 0   Years of education: 12   Highest education level: Master's degree (e.g., MA, MS, MEng, MEd, MSW, MBA)  Occupational History   Occupation: Producer, television/film/video  Tobacco Use   Smoking status: Never   Smokeless tobacco: Never  Vaping Use   Vaping status: Never Used  Substance and Sexual Activity   Alcohol use: Yes    Alcohol/week: 0.0 - 2.0 standard drinks of alcohol   Drug use: Never   Sexual activity: Yes    Partners: Male, Male  Other Topics Concern   Not on file  Social History Narrative   Patient lives at home with wife Walter Evans.   Patient is ambidextrous    I'm a moderately active vegetarian. But I like [place]. But I drink a lot of water.   1 cup of coffee, 6-7 cups of tea/day.   Social Drivers of Corporate investment banker Strain: Low Risk  (08/14/2023)   Overall Financial Resource Strain (CARDIA)    Difficulty of Paying Living Expenses: Not very hard  Food Insecurity: No Food Insecurity (08/14/2023)   Hunger Vital Sign    Worried About Running Out of Food in the Last Year: Never true    Ran Out of Food in the Last Year: Never true  Transportation Needs: No Transportation Needs (08/14/2023)   PRAPARE - Administrator, Civil Service (Medical): No    Lack of Transportation (Non-Medical): No  Physical Activity: Insufficiently Active (08/14/2023)   Exercise Vital Sign    Days of Exercise per Week: 4 days    Minutes of Exercise per Session: 30 min  Stress: No Stress Concern Present (08/14/2023)   [employer] of Occupational Health - Occupational Stress Questionnaire    Feeling of Stress : Only a little  Social Connections: Moderately Integrated (08/14/2023)   Social Connection and Isolation Panel [NHANES]    Frequency of Communication with Friends and Family: Once a week    Frequency of Social Gatherings with Friends and Family: Twice a week    Attends Religious Services: 1 to 4 times  per year    Active Member of [employer] or Organizations: Yes    Attends Banker Meetings: 1 to 4 times per year    Marital Status: Divorced  Catering manager Violence: Not on file    Review of Systems  Constitutional:  Negative for fatigue and unexpected weight change.  Eyes:  Negative for visual disturbance.  Respiratory:  Negative for cough, chest tightness and shortness of breath.   Cardiovascular:  Negative for  chest pain, palpitations and leg swelling.  Gastrointestinal:  Negative for abdominal pain and blood in stool.  Skin:        No new rash.   Neurological:  Negative for dizziness, light-headedness and headaches.     Objective:   Vitals:   08/14/23 0943  BP: 122/86  Pulse: 78  Temp: 98.4 F (36.9 C)  TempSrc: Temporal  SpO2: 99%  Weight: 219 lb (99.3 kg)     Physical Exam Vitals reviewed.  Constitutional:      Appearance: He is well-developed.  HENT:     Head: Normocephalic and atraumatic.  Neck:     Vascular: No carotid bruit or JVD.  Cardiovascular:     Rate and Rhythm: Normal rate and regular rhythm.     Heart sounds: Normal heart sounds. No murmur heard. Pulmonary:     Effort: Pulmonary effort is normal.     Breath sounds: Normal breath sounds. No rales.  Musculoskeletal:     Right lower leg: No edema.     Left lower leg: No edema.  Skin:    General: Skin is warm and dry.  Neurological:     Mental Status: He is alert and oriented to person, place, and time.  Psychiatric:        Mood and Affect: Mood normal.        Assessment & Plan:  Walter Evans is a 43 y.o. male . Hyperlipidemia, unspecified hyperlipidemia type - Plan: Comprehensive metabolic panel, Lipid panel  -Low ASCVD risk score.  Continue diet/activity approach and continue monitoring.  If triglycerides significantly elevated, would consider treatment with possible fenofibrate.  Potential risks of significant hypertriglyceridemia discussed.  Family history of colon  cancer - Plan: Ambulatory referral to Gastroenterology History of colonic polyps - Plan: Ambulatory referral to Gastroenterology Screen for colon cancer - Plan: Ambulatory referral to Gastroenterology  -5-year repeat noted on result letter from last colonoscopy but he did have precancerous polyp, family history of colon cancer and would like to discuss rescreening interval with GI as initially 3 to 5 years recommended.  Referral placed to discuss that plan.  Hyperglycemia - Plan: Comprehensive metabolic panel, Hemoglobin A1c Screening for diabetes mellitus - Plan: Comprehensive metabolic panel, Hemoglobin A1c  -Check A1c, no new meds for now.  No orders of the defined types were placed in this encounter.  Patient Instructions  Thanks for coming in today.  If any concerns on labs I will let you know.  Specifically will look at the triglycerides and the 71-month blood sugar test.  Continue to stay active and watch diet, that usually is sufficient for prediabetes prevention.  I will refer you to gastroenterology to discuss the repeat colonoscopy timing.  Glad to hear you are doing well.  Take care!    Signed,   Meredith Staggers, MD [facility], [facility] 08/14/23 10:19 AM

## 2023-08-14 NOTE — Patient Instructions (Signed)
Thanks for coming in today.  If any concerns on labs I will let you know.  Specifically will look at the triglycerides and the 2-month blood sugar test.  Continue to stay active and watch diet, that usually is sufficient for prediabetes prevention.  I will refer you to gastroenterology to discuss the repeat colonoscopy timing.  Glad to hear you are doing well.  Take care!

## 2023-08-30 ENCOUNTER — Other Ambulatory Visit (HOSPITAL_BASED_OUTPATIENT_CLINIC_OR_DEPARTMENT_OTHER): Payer: Self-pay

## 2023-08-31 ENCOUNTER — Other Ambulatory Visit (HOSPITAL_BASED_OUTPATIENT_CLINIC_OR_DEPARTMENT_OTHER): Payer: Self-pay

## 2023-08-31 MED ORDER — METHOCARBAMOL 500 MG PO TABS
500.0000 mg | ORAL_TABLET | Freq: Every day | ORAL | 1 refills | Status: DC
  Filled 2023-08-31: qty 30, 30d supply, fill #0
  Filled 2023-10-02: qty 30, 30d supply, fill #1

## 2023-08-31 MED ORDER — GABAPENTIN 300 MG PO CAPS
300.0000 mg | ORAL_CAPSULE | Freq: Two times a day (BID) | ORAL | 2 refills | Status: DC | PRN
  Filled 2023-08-31: qty 60, 30d supply, fill #0
  Filled 2023-10-02: qty 60, 30d supply, fill #1
  Filled 2023-11-02: qty 60, 30d supply, fill #2

## 2023-10-19 ENCOUNTER — Other Ambulatory Visit (HOSPITAL_BASED_OUTPATIENT_CLINIC_OR_DEPARTMENT_OTHER): Payer: Self-pay

## 2023-10-19 MED ORDER — METHYLPREDNISOLONE 4 MG PO TBPK
ORAL_TABLET | ORAL | 0 refills | Status: DC
Start: 2023-10-19 — End: 2023-11-23
  Filled 2023-10-19: qty 21, 6d supply, fill #0

## 2023-11-02 ENCOUNTER — Other Ambulatory Visit (HOSPITAL_BASED_OUTPATIENT_CLINIC_OR_DEPARTMENT_OTHER): Payer: Self-pay

## 2023-11-03 ENCOUNTER — Other Ambulatory Visit (HOSPITAL_BASED_OUTPATIENT_CLINIC_OR_DEPARTMENT_OTHER): Payer: Self-pay

## 2023-11-03 MED ORDER — METHOCARBAMOL 500 MG PO TABS
500.0000 mg | ORAL_TABLET | Freq: Every day | ORAL | 1 refills | Status: DC
  Filled 2023-11-03: qty 30, 30d supply, fill #0
  Filled 2023-11-30: qty 30, 30d supply, fill #1

## 2023-11-06 ENCOUNTER — Other Ambulatory Visit (HOSPITAL_COMMUNITY): Payer: Self-pay | Admitting: Orthopedic Surgery

## 2023-11-06 DIAGNOSIS — M545 Low back pain, unspecified: Secondary | ICD-10-CM

## 2023-11-09 ENCOUNTER — Other Ambulatory Visit (HOSPITAL_COMMUNITY): Payer: Self-pay | Admitting: Orthopedic Surgery

## 2023-11-09 DIAGNOSIS — M545 Low back pain, unspecified: Secondary | ICD-10-CM

## 2023-11-10 ENCOUNTER — Ambulatory Visit (HOSPITAL_COMMUNITY)
Admission: RE | Admit: 2023-11-10 | Discharge: 2023-11-10 | Disposition: A | Discharge status: Home / Self Care | Source: Ambulatory Visit | Attending: Orthopedic Surgery | Admitting: Orthopedic Surgery

## 2023-11-10 DIAGNOSIS — M545 Low back pain, unspecified: Secondary | ICD-10-CM

## 2023-11-17 ENCOUNTER — Other Ambulatory Visit (HOSPITAL_BASED_OUTPATIENT_CLINIC_OR_DEPARTMENT_OTHER): Payer: Self-pay

## 2023-11-17 MED ORDER — LIDOCAINE 5 % EX PTCH
1.0000 | MEDICATED_PATCH | CUTANEOUS | 2 refills | Status: AC
  Filled 2023-11-17: qty 30, 30d supply, fill #0

## 2023-11-30 ENCOUNTER — Other Ambulatory Visit (HOSPITAL_BASED_OUTPATIENT_CLINIC_OR_DEPARTMENT_OTHER): Payer: Self-pay

## 2023-12-01 ENCOUNTER — Other Ambulatory Visit (HOSPITAL_BASED_OUTPATIENT_CLINIC_OR_DEPARTMENT_OTHER): Payer: Self-pay

## 2023-12-01 MED ORDER — GABAPENTIN 300 MG PO CAPS
300.0000 mg | ORAL_CAPSULE | Freq: Two times a day (BID) | ORAL | 2 refills | Status: DC | PRN
  Filled 2023-12-01: qty 25, 13d supply, fill #0
  Filled 2023-12-01: qty 35, 17d supply, fill #0
  Filled 2024-01-03: qty 60, 30d supply, fill #1
  Filled 2024-02-05: qty 60, 30d supply, fill #2

## 2024-01-03 ENCOUNTER — Other Ambulatory Visit (HOSPITAL_BASED_OUTPATIENT_CLINIC_OR_DEPARTMENT_OTHER): Payer: Self-pay

## 2024-01-04 ENCOUNTER — Other Ambulatory Visit (HOSPITAL_BASED_OUTPATIENT_CLINIC_OR_DEPARTMENT_OTHER): Payer: Self-pay

## 2024-01-04 MED ORDER — METHOCARBAMOL 500 MG PO TABS
500.0000 mg | ORAL_TABLET | Freq: Every day | ORAL | 1 refills | Status: DC
  Filled 2024-01-04: qty 30, 30d supply, fill #0
  Filled 2024-02-05: qty 30, 30d supply, fill #1

## 2024-02-21 ENCOUNTER — Encounter: Payer: Self-pay | Admitting: Family Medicine

## 2024-02-21 ENCOUNTER — Other Ambulatory Visit (HOSPITAL_BASED_OUTPATIENT_CLINIC_OR_DEPARTMENT_OTHER): Payer: Self-pay

## 2024-02-21 ENCOUNTER — Other Ambulatory Visit (HOSPITAL_COMMUNITY)
Admission: RE | Admit: 2024-02-21 | Discharge: 2024-02-21 | Disposition: A | Discharge status: Home / Self Care | Source: Ambulatory Visit | Attending: Family Medicine | Admitting: Family Medicine

## 2024-02-21 ENCOUNTER — Ambulatory Visit: Payer: No Typology Code available for payment source | Admitting: Family Medicine

## 2024-02-21 VITALS — BP 100/68 | HR 87 | Ht 72.0 in | Wt 212.0 lb

## 2024-02-21 DIAGNOSIS — Z113 Encounter for screening for infections with a predominantly sexual mode of transmission: Secondary | ICD-10-CM | POA: Insufficient documentation

## 2024-02-21 DIAGNOSIS — E785 Hyperlipidemia, unspecified: Secondary | ICD-10-CM | POA: Diagnosis not present

## 2024-02-21 DIAGNOSIS — L409 Psoriasis, unspecified: Secondary | ICD-10-CM

## 2024-02-21 DIAGNOSIS — R739 Hyperglycemia, unspecified: Secondary | ICD-10-CM | POA: Diagnosis not present

## 2024-02-21 DIAGNOSIS — Z23 Encounter for immunization: Secondary | ICD-10-CM | POA: Diagnosis not present

## 2024-02-21 DIAGNOSIS — Z0000 Encounter for general adult medical examination without abnormal findings: Secondary | ICD-10-CM

## 2024-02-21 DIAGNOSIS — Z0184 Encounter for antibody response examination: Secondary | ICD-10-CM

## 2024-02-21 LAB — COMPREHENSIVE METABOLIC PANEL WITH GFR
ALT: 13 U/L (ref 0–53)
AST: 13 U/L (ref 0–37)
Albumin: 4.8 g/dL (ref 3.5–5.2)
Alkaline Phosphatase: 86 U/L (ref 39–117)
BUN: 12 mg/dL (ref 6–23)
CO2: 30 meq/L (ref 19–32)
Calcium: 9.5 mg/dL (ref 8.4–10.5)
Chloride: 101 meq/L (ref 96–112)
Creatinine, Ser: 0.9 mg/dL (ref 0.40–1.50)
GFR: 106.58 mL/min (ref >60.00)
Glucose, Bld: 93 mg/dL (ref 70–99)
Potassium: 4.3 meq/L (ref 3.5–5.1)
Sodium: 138 meq/L (ref 135–145)
Total Bilirubin: 0.7 mg/dL (ref 0.2–1.2)
Total Protein: 7.2 g/dL (ref 6.0–8.3)

## 2024-02-21 LAB — LIPID PANEL
Cholesterol: 245 mg/dL — ABNORMAL HIGH (ref 0–200)
HDL: 41.3 mg/dL (ref >39.00)
LDL Cholesterol: 157 mg/dL — ABNORMAL HIGH (ref 0–99)
NonHDL: 203.25
Total CHOL/HDL Ratio: 6
Triglycerides: 232 mg/dL — ABNORMAL HIGH (ref 0.0–149.0)
VLDL: 46.4 mg/dL — ABNORMAL HIGH (ref 0.0–40.0)

## 2024-02-21 LAB — HEMOGLOBIN A1C: Hgb A1c MFr Bld: 5.5 % (ref 4.6–6.5)

## 2024-02-21 MED ORDER — TRIAMCINOLONE ACETONIDE 0.025 % EX LOTN
TOPICAL_LOTION | CUTANEOUS | 2 refills | Status: AC
  Filled 2024-02-21 (×2): qty 60, 30d supply, fill #0

## 2024-02-21 MED ORDER — TRIAMCINOLONE ACETONIDE 0.025 % EX CREA
TOPICAL_CREAM | CUTANEOUS | 2 refills | Status: AC
  Filled 2024-02-21: qty 30, 30d supply, fill #0

## 2024-02-21 NOTE — Patient Instructions (Addendum)
 Thank you for coming in today. No change in medications at this time. If there are any concerns on your bloodwork, I will let you know. Take care! Preventive Care 41-41 Years Old, Male Preventive care refers to lifestyle choices and visits with your health care provider that can promote health and wellness. Preventive care visits are also called wellness exams. What can I expect for my preventive care visit? Counseling During your preventive care visit, your health care provider may ask about your: Medical history, including: Past medical problems. Family medical history. Current health, including: Emotional well-being. Home life and relationship well-being. Sexual activity. Lifestyle, including: Alcohol, nicotine or tobacco, and drug use. Access to firearms. Diet, exercise, and sleep habits. Safety issues such as seatbelt and bike helmet use. Sunscreen use. Work and work Astronomer. Physical exam Your health care provider will check your: Height and weight. These may be used to calculate your BMI (body mass index). BMI is a measurement that tells if you are at a healthy weight. Waist circumference. This measures the distance around your waistline. This measurement also tells if you are at a healthy weight and may help predict your risk of certain diseases, such as type 2 diabetes and high blood pressure. Heart rate and blood pressure. Body temperature. Skin for abnormal spots. What immunizations do I need?  Vaccines are usually given at various ages, according to a schedule. Your health care provider will recommend vaccines for you based on your age, medical history, and lifestyle or other factors, such as travel or where you work. What tests do I need? Screening Your health care provider may recommend screening tests for certain conditions. This may include: Lipid and cholesterol levels. Diabetes screening. This is done by checking your blood sugar (glucose) after you have not  eaten for a while (fasting). Hepatitis B test. Hepatitis C test. HIV (human immunodeficiency virus) test. STI (sexually transmitted infection) testing, if you are at risk. Lung cancer screening. Prostate cancer screening. Colorectal cancer screening. Talk with your health care provider about your test results, treatment options, and if necessary, the need for more tests. Follow these instructions at home: Eating and drinking  Eat a diet that includes fresh fruits and vegetables, whole grains, lean protein, and low-fat dairy products. Take vitamin and mineral supplements as recommended by your health care provider. Do not drink alcohol if your health care provider tells you not to drink. If you drink alcohol: Limit how much you have to 0-2 drinks a day. Know how much alcohol is in your drink. In the [place] one drink equals one 12 oz bottle of beer (355 mL), one 5 oz glass of wine (148 mL), or one 1 oz glass of hard liquor (44 mL). Lifestyle Brush your teeth every morning and night with fluoride toothpaste. Floss one time each day. Exercise for at least 30 minutes 5 or more days each week. Do not use any products that contain nicotine or tobacco. These products include cigarettes, chewing tobacco, and vaping devices, such as e-cigarettes. If you need help quitting, ask your health care provider. Do not use drugs. If you are sexually active, practice safe sex. Use a condom or other form of protection to prevent STIs. Take aspirin  only as told by your health care provider. Make sure that you understand how much to take and what form to take. Work with your health care provider to find out whether it is safe and beneficial for you to take aspirin  daily. Find healthy ways to manage  stress, such as: Meditation, yoga, or listening to music. Journaling. Talking to a trusted person. Spending time with friends and family. Minimize exposure to UV radiation to reduce your risk of skin  cancer. Safety Always wear your seat belt while driving or riding in a vehicle. Do not drive: If you have been drinking alcohol. Do not ride with someone who has been drinking. When you are tired or distracted. While texting. If you have been using any mind-altering substances or drugs. Wear a helmet and other protective equipment during sports activities. If you have firearms in your house, make sure you follow all gun safety procedures. What's next? Go to your health care provider once a year for an annual wellness visit. Ask your health care provider how often you should have your eyes and teeth checked. Stay up to date on all vaccines. This information is not intended to replace advice given to you by your health care provider. Make sure you discuss any questions you have with your health care provider. Document Revised: 12/23/2020 Document Reviewed: 12/23/2020 Elsevier Patient Education  2024 [employer].

## 2024-02-21 NOTE — Progress Notes (Signed)
 Subjective:  Patient ID: Walter Evans, male    DOB: August 15, 1980  Age: 43 y.o. MRN: [id number]  CC:  Chief Complaint  Patient presents with   Annual Exam    No concerns  Patient states he received Hep B vaccine in 2018 but I am not able to locate this information. Did state he is okay with receiving another and would like to discuss HPV vaccine.     HPI Walter Evans presents for Annual Exam  Ortho/spine, Dr. Burnetta, spinal stimulator in place.  Physical therapy, treated with methocarbamol , gabapentin  previously. Felt pop in spine few months ago, grabbing EMT bag. MRI showed disc herniation - treated with PT,  re-tuned spinal stimulator. Improved. Finished PT 3-4 weeks ago.  Dr Bonner - pain mgt. Has lidocaine  patch - not using, 500mg  - methocarbamol  at bedtime. Gabapentin  600mg  at bedtime.   HSV: Last flare 10 months ago. Has valtrex  as needed.  Requests routine screening today - no new sexual partners.   Psoriasis Treated with triamcinolone  cream for skin, triamcinolone  lotion for scalp and eyebrows, effective with current treatment plan. Some on fingers at times. Declines new meds for now.   Hyperglycemia, borderline prediabetes Lab Results  Component Value Date   HGBA1C 5.7 08/14/2023  Last A1c in February, weight has improved.  Wt Readings from Last 3 Encounters:  02/21/24 212 lb (96.2 kg)  08/14/23 219 lb (99.3 kg)  01/26/23 228 lb 3.2 oz (103.5 kg)   Hyperlipidemia: See prior notes.  No family history of early heart disease, multiple family members with HLD.  No meds.  Diet/exercise approach.  No ASCVD risk score. The 10-year ASCVD risk score (Arnett DK, et al., 2019) is: 1.7%   Values used to calculate the score:     Age: 72 years     Clincally relevant sex: Male     Is Non-Hispanic African American: No     Diabetic: No     Tobacco smoker: No     Systolic Blood Pressure: 100 mmHg     Is BP treated: No     HDL Cholesterol: 45.4 mg/dL     Total Cholesterol: 287  mg/dL  Lab Results  Component Value Date   CHOL 287 (H) 08/14/2023   HDL 45.40 08/14/2023   LDLCALC 187 (H) 08/14/2023   LDLDIRECT 188.0 02/09/2023   TRIG 271.0 (H) 08/14/2023   CHOLHDL 6 08/14/2023   Lab Results  Component Value Date   ALT 14 08/14/2023   AST 15 08/14/2023   ALKPHOS 98 08/14/2023   BILITOT 0.6 08/14/2023        02/21/2024    9:43 AM 08/14/2023    9:43 AM 01/26/2023    3:48 PM 06/30/2022    3:37 PM 01/19/2022    9:10 AM  Depression screen PHQ 2/9  Decreased Interest 0 0 1 0 0  Down, Depressed, Hopeless 1 1 1 1  0  PHQ - 2 Score 1 1 2 1  0  Altered sleeping 1 0 2 1 1   Tired, decreased energy 0 1 2 1  0  Change in appetite 0 0 2 0 0  Feeling bad or failure about yourself  0 0 1 0 0  Trouble concentrating 1 0 2 0 0  Moving slowly or fidgety/restless 0 1 1 0 1  Suicidal thoughts 0 0 0 0 0  PHQ-9 Score 3 3 12 3 2   Difficult doing work/chores Not difficult at all    Not difficult at all  Health Maintenance  Topic Date Due   Hepatitis B Vaccines (1 of 3 - 19+ 3-dose series) Never done   HPV VACCINES (1 - 3-dose SCDM series) Never done   INFLUENZA VACCINE  02/09/2024   Colonoscopy  01/13/2026   DTaP/Tdap/Td (2 - Td or Tdap) 02/11/2026   COVID-19 Vaccine  Completed   Hepatitis C Screening  Completed   HIV Screening  Completed   Meningococcal B Vaccine  Aged Out  Colonoscopy in July 2022 with repeat in 5 years, previously treated by Dr. Aneita. Prior hyperplastic and serrated polyp  Family history of colon cancer in mother and grandmother.  Mother had hemicolectomy. Prostate: does not have family history of prostate cancer The natural history of prostate cancer and ongoing controversy regarding screening and potential treatment outcomes of prostate cancer has been discussed with the patient. The meaning of a false positive PSA and a false negative PSA has been discussed. He indicates understanding of the limitations of this screening test and wishes NOT to  proceed with screening PSA testing at this time.    Immunization History  Administered Date(s) Administered   Hepatitis A, Adult 08/02/2016, 03/16/2017   Influenza, Seasonal, Injecte, Preservative Fre 03/30/2023   Meningococcal Mcv4o 08/02/2016   Pfizer Covid-19 Vaccine Bivalent Booster 56yrs & up 04/14/2021   Pfizer(Comirnaty )Fall Seasonal Vaccine 12 years and older 04/22/2022, 03/30/2023   Tdap 02/12/2016   Flu and covid booster planned in fall.  Prior Hep B vaccine in past. Requests titer.  HPV vaccine - requests today.   No results found. Optho - infrequent - every few years, no vision changes.   Dental: 2 times per year.   Alcohol: 1-2 per month  Tobacco: none  Exercise: physical therapy. Limited after back flare above.    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   Past Medical History:  Diagnosis Date   Allergy    Arthritis    RIGHT knee and back   Back pain    L3-L4   Depression    HSV-2 (herpes simplex virus 2) infection    MRSA (methicillin resistant Staphylococcus aureus)    Neuromuscular disorder (HCC)    Psoriasis    Past Surgical History:  Procedure Laterality Date   KNEE ARTHROTOMY Right    TONSILLECTOMY  07/11/2005   WISDOM TOOTH EXTRACTION  07/11/2001   Allergies  Allergen Reactions   Other     Cannabinoids- seizure activity    Fish Oil     Allergic to all fish    Gelatin Other (See Comments)   Shellfish Allergy     unknown   Prior to Admission medications   Medication Sig Start Date End Date Taking? Authorizing Provider  gabapentin  (NEURONTIN ) 300 MG capsule Take 1 capsule (300 mg total) by mouth 2 (two) times daily as needed. 12/01/23  Yes   lidocaine  (LIDODERM ) 5 % Place 1 patch onto the skin as directed. May  wear up to 12 hours on then 12 hours off. 11/17/23  Yes   methocarbamol  (ROBAXIN ) 500 MG tablet Take 1 tablet (500 mg total) by mouth at bedtime. 01/04/24  Yes   triamcinolone  (KENALOG ) 0.025 % cream Apply sparingly to affected areas. 02/09/23  Yes Levora Reyes SAUNDERS, MD  Triamcinolone  Acetonide 0.025 % LOTN Apply sparingly to affected areas. 02/09/23  Yes Levora Reyes SAUNDERS, MD   Social History   Socioeconomic History   Marital status: Divorced    Spouse name: Carissa    Number of children: 0   Years of education: 12   Highest education level: Master's degree (e.g., MA, MS, MEng, MEd, MSW, MBA)  Occupational History   Occupation: Producer, television/film/video  Tobacco Use   Smoking status: Never   Smokeless tobacco: Never  Vaping Use   Vaping status: Never Used  Substance and Sexual Activity   Alcohol use: Yes    Alcohol/week: 0.0 - 2.0 standard drinks of alcohol   Drug use: Never   Sexual activity: Yes    Partners: Male, Male  Other Topics Concern   Not on file  Social History Narrative   Patient lives at home with wife Maudie.   Patient is ambidextrous    I'm a moderately active vegetarian. But I like [place]. But I drink a lot of water.   1 cup of coffee, 6-7 cups of tea/day.   Social Drivers of Corporate investment banker Strain: Low Risk  (08/14/2023)   Overall Financial Resource Strain (CARDIA)    Difficulty of Paying Living Expenses: Not very hard  Food Insecurity: No Food Insecurity (08/14/2023)   Hunger Vital Sign    Worried About Running Out of Food in the Last Year: Never true    Ran Out of Food in the Last Year: Never  true  Transportation Needs: No Transportation Needs (08/14/2023)   PRAPARE - Administrator, Civil Service (Medical): No    Lack of Transportation (Non-Medical): No  Physical Activity: Insufficiently Active (08/14/2023)   Exercise Vital Sign    Days of Exercise per Week: 4 days    Minutes of Exercise per Session: 30 min  Stress: No Stress Concern Present (08/14/2023)   [employer] of Occupational Health - Occupational Stress Questionnaire    Feeling of Stress : Only a little  Social Connections: Moderately Integrated (08/14/2023)   Social Connection and Isolation Panel    Frequency of Communication with Friends and Family: Once a week    Frequency of Social Gatherings with Friends and Family: Twice a week    Attends Religious Services: 1 to 4 times per year    Active Member of [employer] or Organizations: Yes    Attends Banker Meetings: 1 to 4 times per year    Marital Status: Divorced  Catering manager Violence: Not on file    Review of Systems 13 point review of systems per patient health survey noted.  Negative other than as indicated above or in HPI.    Objective:   Vitals:   02/21/24 0929  BP: 100/68  Pulse: 87  SpO2: 98%  Weight: 212 lb (96.2 kg)  Height: 6' (1.829 m)     Physical Exam Vitals reviewed.  Constitutional:      Appearance: He is well-developed.  HENT:     Head: Normocephalic and atraumatic.     Right Ear: External ear normal.     Left Ear: External ear normal.  Eyes:  Conjunctiva/sclera: Conjunctivae normal.     Pupils: Pupils are equal, round, and reactive to light.  Neck:     Thyroid: No thyromegaly.  Cardiovascular:     Rate and Rhythm: Normal rate and regular rhythm.     Heart sounds: Normal heart sounds.  Pulmonary:     Effort: Pulmonary effort is normal. No respiratory distress.     Breath sounds: Normal breath sounds. No wheezing.  Abdominal:     General: There is no distension.     Palpations: Abdomen is soft.      Tenderness: There is no abdominal tenderness.  Musculoskeletal:        General: No tenderness. Normal range of motion.     Cervical back: Normal range of motion and neck supple.  Lymphadenopathy:     Cervical: No cervical adenopathy.  Skin:    General: Skin is warm and dry.  Neurological:     Mental Status: He is alert and oriented to person, place, and time.     Deep Tendon Reflexes: Reflexes are normal and symmetric.  Psychiatric:        Behavior: Behavior normal.        Assessment & Plan:  Walter Evans is a 43 y.o. male . Annual physical exam - Plan: Hemoglobin A1c, Lipid panel, Comp Met (CMET), RPR, HIV Antibody (routine testing w rflx), Urine cytology ancillary only  - -anticipatory guidance as below in AVS, screening labs above. Health maintenance items as above in HPI discussed/recommended as applicable.   Hyperlipidemia, unspecified hyperlipidemia type - Plan: Lipid panel, Comp Met (CMET)  - Low 10-year ASCVD risk score, repeat labs, adjust plan accordingly.  No new meds for now  Hyperglycemia - Plan: Hemoglobin A1c  - Borderline prediabetes previously, updated labs ordered.  Anticipate improvement with weight loss.  Routine screening for STI (sexually transmitted infection) - Plan: RPR, HIV Antibody (routine testing w rflx), Urine cytology ancillary only  Psoriasis - Plan: Triamcinolone  Acetonide 0.025 % LOTN, triamcinolone  (KENALOG ) 0.025 % cream  - Stable with current regimen of lotion, cream for specific areas above, refilled same.  RTC precautions if increased areas of involvement or ineffective.  Need for HPV vaccination - Plan: HPV 9-valent vaccine,Recombinat First dose given, follow-up intervals discussed.  Immunity status testing - Plan: Hepatitis B surface antibody,quantitative Likely hep B immune based on history, titer ordered.   Meds ordered this encounter  Medications   Triamcinolone  Acetonide 0.025 % LOTN    Sig: Apply sparingly to affected  areas.    Dispense:  60 mL    Refill:  2   triamcinolone  (KENALOG ) 0.025 % cream    Sig: Apply sparingly to affected areas.    Dispense:  30 g    Refill:  2   Patient Instructions  Thank you for coming in today. No change in medications at this time. If there are any concerns on your bloodwork, I will let you know. Take care!  Preventive Care 61-41 Years Old, Male Preventive care refers to lifestyle choices and visits with your health care provider that can promote health and wellness. Preventive care visits are also called wellness exams. What can I expect for my preventive care visit?  Counseling During your preventive care visit, your health care provider may ask about your: Medical history, including: Past medical problems. Family medical history. Current health, including: Emotional well-being. Home life and relationship well-being. Sexual activity. Lifestyle, including: Alcohol, nicotine or tobacco, and drug use. Access to firearms. Diet, exercise, and sleep  habits. Safety issues such as seatbelt and bike helmet use. Sunscreen use. Work and work Astronomer. Physical exam Your health care provider will check your: Height and weight. These may be used to calculate your BMI (body mass index). BMI is a measurement that tells if you are at a healthy weight. Waist circumference. This measures the distance around your waistline. This measurement also tells if you are at a healthy weight and may help predict your risk of certain diseases, such as type 2 diabetes and high blood pressure. Heart rate and blood pressure. Body temperature. Skin for abnormal spots. What immunizations do I need?  Vaccines are usually given at various ages, according to a schedule. Your health care provider will recommend vaccines for you based on your age, medical history, and lifestyle or other factors, such as travel or where you work. What tests do I need? Screening Your health care provider may  recommend screening tests for certain conditions. This may include: Lipid and cholesterol levels. Diabetes screening. This is done by checking your blood sugar (glucose) after you have not eaten for a while (fasting). Hepatitis B test. Hepatitis C test. HIV (human immunodeficiency virus) test. STI (sexually transmitted infection) testing, if you are at risk. Lung cancer screening. Prostate cancer screening. Colorectal cancer screening. Talk with your health care provider about your test results, treatment options, and if necessary, the need for more tests. Follow these instructions at home: Eating and drinking  Eat a diet that includes fresh fruits and vegetables, whole grains, lean protein, and low-fat dairy products. Take vitamin and mineral supplements as recommended by your health care provider. Do not drink alcohol if your health care provider tells you not to drink. If you drink alcohol: Limit how much you have to 0-2 drinks a day. Know how much alcohol is in your drink. In the [place] one drink equals one 12 oz bottle of beer (355 mL), one 5 oz glass of wine (148 mL), or one 1 oz glass of hard liquor (44 mL). Lifestyle Brush your teeth every morning and night with fluoride toothpaste. Floss one time each day. Exercise for at least 30 minutes 5 or more days each week. Do not use any products that contain nicotine or tobacco. These products include cigarettes, chewing tobacco, and vaping devices, such as e-cigarettes. If you need help quitting, ask your health care provider. Do not use drugs. If you are sexually active, practice safe sex. Use a condom or other form of protection to prevent STIs. Take aspirin only as told by your health care provider. Make sure that you understand how much to take and what form to take. Work with your health care provider to find out whether it is safe and beneficial for you to take aspirin daily. Find healthy ways to manage stress, such  as: Meditation, yoga, or listening to music. Journaling. Talking to a trusted person. Spending time with friends and family. Minimize exposure to UV radiation to reduce your risk of skin cancer. Safety Always wear your seat belt while driving or riding in a vehicle. Do not drive: If you have been drinking alcohol. Do not ride with someone who has been drinking. When you are tired or distracted. While texting. If you have been using any mind-altering substances or drugs. Wear a helmet and other protective equipment during sports activities. If you have firearms in your house, make sure you follow all gun safety procedures. What's next? Go to your health care provider once a year for an  annual wellness visit. Ask your health care provider how often you should have your eyes and teeth checked. Stay up to date on all vaccines. This information is not intended to replace advice given to you by your health care provider. Make sure you discuss any questions you have with your health care provider. Document Revised: 12/23/2020 Document Reviewed: 12/23/2020 Elsevier Patient Education  2024 Elsevier Inc.     Signed,   Reyes Pines, MD [facility], [facility] 02/21/24 10:33 AM

## 2024-02-22 ENCOUNTER — Encounter: Payer: Self-pay | Admitting: Family Medicine

## 2024-02-22 ENCOUNTER — Ambulatory Visit: Payer: Self-pay | Admitting: Family Medicine

## 2024-02-22 LAB — HIV ANTIBODY (ROUTINE TESTING W REFLEX): HIV 1&2 Ab, 4th Generation: NONREACTIVE

## 2024-02-22 LAB — RPR: RPR Ser Ql: NONREACTIVE

## 2024-02-22 LAB — HEPATITIS B SURFACE ANTIBODY, QUANTITATIVE: Hep B S AB Quant (Post): >1000 m[IU]/mL (ref >=10)

## 2024-02-22 LAB — URINE CYTOLOGY ANCILLARY ONLY
Chlamydia: NEGATIVE
Comment: NEGATIVE
Comment: NEGATIVE
Comment: NORMAL
Neisseria Gonorrhea: NEGATIVE
Trichomonas: NEGATIVE

## 2024-03-04 ENCOUNTER — Other Ambulatory Visit (HOSPITAL_BASED_OUTPATIENT_CLINIC_OR_DEPARTMENT_OTHER): Payer: Self-pay

## 2024-03-04 MED ORDER — GABAPENTIN 300 MG PO CAPS
300.0000 mg | ORAL_CAPSULE | Freq: Two times a day (BID) | ORAL | 2 refills | Status: DC | PRN
  Filled 2024-03-04: qty 60, 30d supply, fill #0
  Filled 2024-04-03: qty 60, 30d supply, fill #1
  Filled 2024-05-05: qty 60, 30d supply, fill #2

## 2024-03-04 MED ORDER — METHOCARBAMOL 500 MG PO TABS
500.0000 mg | ORAL_TABLET | Freq: Every day | ORAL | 1 refills | Status: DC
  Filled 2024-03-04: qty 30, 30d supply, fill #0
  Filled 2024-04-03: qty 30, 30d supply, fill #1

## 2024-03-27 ENCOUNTER — Other Ambulatory Visit (HOSPITAL_BASED_OUTPATIENT_CLINIC_OR_DEPARTMENT_OTHER): Payer: Self-pay

## 2024-03-27 MED ORDER — FLUZONE 0.5 ML IM SUSY
0.5000 mL | PREFILLED_SYRINGE | Freq: Once | INTRAMUSCULAR | 0 refills | Status: AC
  Filled 2024-03-27: qty 0.5, 1d supply, fill #0

## 2024-03-27 MED ORDER — COMIRNATY 30 MCG/0.3ML IM SUSY
0.3000 mL | PREFILLED_SYRINGE | Freq: Once | INTRAMUSCULAR | 0 refills | Status: AC
  Filled 2024-03-27: qty 0.3, 1d supply, fill #0

## 2024-05-05 ENCOUNTER — Other Ambulatory Visit (HOSPITAL_BASED_OUTPATIENT_CLINIC_OR_DEPARTMENT_OTHER): Payer: Self-pay

## 2024-05-06 ENCOUNTER — Other Ambulatory Visit (HOSPITAL_BASED_OUTPATIENT_CLINIC_OR_DEPARTMENT_OTHER): Payer: Self-pay

## 2024-05-06 ENCOUNTER — Other Ambulatory Visit: Payer: Self-pay

## 2024-05-06 MED ORDER — METHOCARBAMOL 500 MG PO TABS
500.0000 mg | ORAL_TABLET | Freq: Every day | ORAL | 1 refills | Status: DC
  Filled 2024-05-06: qty 30, 30d supply, fill #0
  Filled 2024-06-03: qty 30, 30d supply, fill #1

## 2024-05-23 ENCOUNTER — Ambulatory Visit (INDEPENDENT_AMBULATORY_CARE_PROVIDER_SITE_OTHER)

## 2024-05-23 DIAGNOSIS — Z23 Encounter for immunization: Secondary | ICD-10-CM | POA: Diagnosis not present

## 2024-05-23 NOTE — Progress Notes (Signed)
 Walter Evans is a 43 y.o. male presents in office today for a nurse visit for HPV Immunization.    Patient Injection was given in the  Left deltoid. Patient tolerated injection well.   Patient's next injection due n/a, appt made? not applicable  [employer]

## 2024-06-03 ENCOUNTER — Other Ambulatory Visit (HOSPITAL_BASED_OUTPATIENT_CLINIC_OR_DEPARTMENT_OTHER): Payer: Self-pay

## 2024-06-04 ENCOUNTER — Other Ambulatory Visit: Payer: Self-pay

## 2024-06-04 ENCOUNTER — Other Ambulatory Visit (HOSPITAL_BASED_OUTPATIENT_CLINIC_OR_DEPARTMENT_OTHER): Payer: Self-pay

## 2024-06-04 MED ORDER — GABAPENTIN 300 MG PO CAPS
300.0000 mg | ORAL_CAPSULE | Freq: Two times a day (BID) | ORAL | 2 refills | Status: AC | PRN
  Filled 2024-06-04: qty 60, 30d supply, fill #0
  Filled 2024-07-07: qty 60, 30d supply, fill #1
  Filled 2024-08-03: qty 60, 30d supply, fill #2

## 2024-07-07 ENCOUNTER — Other Ambulatory Visit (HOSPITAL_BASED_OUTPATIENT_CLINIC_OR_DEPARTMENT_OTHER): Payer: Self-pay

## 2024-07-08 ENCOUNTER — Other Ambulatory Visit (HOSPITAL_BASED_OUTPATIENT_CLINIC_OR_DEPARTMENT_OTHER): Payer: Self-pay

## 2024-07-08 ENCOUNTER — Other Ambulatory Visit: Payer: Self-pay

## 2024-07-08 MED ORDER — METHOCARBAMOL 500 MG PO TABS
500.0000 mg | ORAL_TABLET | Freq: Every day | ORAL | 1 refills | Status: AC
  Filled 2024-07-08: qty 30, 30d supply, fill #0
  Filled 2024-08-03: qty 30, 30d supply, fill #1

## 2024-07-12 ENCOUNTER — Other Ambulatory Visit (HOSPITAL_BASED_OUTPATIENT_CLINIC_OR_DEPARTMENT_OTHER): Payer: Self-pay

## 2025-02-21 ENCOUNTER — Encounter: Admitting: Family Medicine
